# Patient Record
Sex: Female | Born: 1950 | Race: White | Hispanic: No | Marital: Single | Smoking: Never smoker
Health system: Southern US, Community
[De-identification: ages and names within clinical notes are randomized; demographics above are authoritative.]

## PROBLEM LIST (undated history)

## (undated) DIAGNOSIS — M1990 Unspecified osteoarthritis, unspecified site: Secondary | ICD-10-CM

## (undated) DIAGNOSIS — R928 Other abnormal and inconclusive findings on diagnostic imaging of breast: Secondary | ICD-10-CM

## (undated) DIAGNOSIS — M542 Cervicalgia: Secondary | ICD-10-CM

## (undated) DIAGNOSIS — H4020X0 Unspecified primary angle-closure glaucoma, stage unspecified: Secondary | ICD-10-CM

## (undated) DIAGNOSIS — F419 Anxiety disorder, unspecified: Secondary | ICD-10-CM

## (undated) HISTORY — PX: BREAST BIOPSY: SHX20

## (undated) HISTORY — PX: MOUTH SURGERY: SHX715

## (undated) HISTORY — DX: Unspecified osteoarthritis, unspecified site: M19.90

---

## 2010-10-16 ENCOUNTER — Encounter: Admission: RE | Admit: 2010-10-16 | Discharge: 2010-10-16 | Payer: Self-pay | Admitting: *Deleted

## 2011-09-27 ENCOUNTER — Other Ambulatory Visit: Payer: Self-pay | Admitting: Internal Medicine

## 2011-09-27 DIAGNOSIS — Z1231 Encounter for screening mammogram for malignant neoplasm of breast: Secondary | ICD-10-CM

## 2011-10-21 ENCOUNTER — Other Ambulatory Visit: Payer: Self-pay | Admitting: Family Medicine

## 2011-10-21 ENCOUNTER — Ambulatory Visit
Admission: RE | Admit: 2011-10-21 | Discharge: 2011-10-21 | Disposition: A | Discharge status: Home / Self Care | Payer: Private Health Insurance - Indemnity | Source: Ambulatory Visit | Attending: Internal Medicine | Admitting: Internal Medicine

## 2011-10-21 DIAGNOSIS — Z1231 Encounter for screening mammogram for malignant neoplasm of breast: Secondary | ICD-10-CM

## 2011-10-26 ENCOUNTER — Other Ambulatory Visit: Payer: Self-pay

## 2011-10-26 ENCOUNTER — Other Ambulatory Visit (HOSPITAL_COMMUNITY)
Admission: RE | Admit: 2011-10-26 | Discharge: 2011-10-26 | Disposition: A | Discharge status: Home / Self Care | Payer: Private Health Insurance - Indemnity | Source: Ambulatory Visit | Attending: Family Medicine | Admitting: Family Medicine

## 2011-10-26 DIAGNOSIS — Z01419 Encounter for gynecological examination (general) (routine) without abnormal findings: Secondary | ICD-10-CM | POA: Insufficient documentation

## 2011-11-01 ENCOUNTER — Other Ambulatory Visit: Payer: Self-pay | Admitting: Family Medicine

## 2011-11-01 DIAGNOSIS — R928 Other abnormal and inconclusive findings on diagnostic imaging of breast: Secondary | ICD-10-CM

## 2011-11-15 ENCOUNTER — Ambulatory Visit
Admission: RE | Admit: 2011-11-15 | Discharge: 2011-11-15 | Disposition: A | Discharge status: Home / Self Care | Payer: Private Health Insurance - Indemnity | Source: Ambulatory Visit | Attending: Family Medicine | Admitting: Family Medicine

## 2011-11-15 ENCOUNTER — Other Ambulatory Visit: Payer: Self-pay | Admitting: Family Medicine

## 2011-11-15 DIAGNOSIS — R928 Other abnormal and inconclusive findings on diagnostic imaging of breast: Secondary | ICD-10-CM

## 2011-11-15 DIAGNOSIS — R921 Mammographic calcification found on diagnostic imaging of breast: Secondary | ICD-10-CM

## 2011-11-22 ENCOUNTER — Ambulatory Visit
Admission: RE | Admit: 2011-11-22 | Discharge: 2011-11-22 | Disposition: A | Discharge status: Home / Self Care | Payer: Private Health Insurance - Indemnity | Source: Ambulatory Visit | Attending: Family Medicine | Admitting: Family Medicine

## 2011-11-22 DIAGNOSIS — R921 Mammographic calcification found on diagnostic imaging of breast: Secondary | ICD-10-CM

## 2011-12-14 DIAGNOSIS — R928 Other abnormal and inconclusive findings on diagnostic imaging of breast: Secondary | ICD-10-CM

## 2011-12-14 HISTORY — DX: Other abnormal and inconclusive findings on diagnostic imaging of breast: R92.8

## 2011-12-16 ENCOUNTER — Ambulatory Visit (INDEPENDENT_AMBULATORY_CARE_PROVIDER_SITE_OTHER): Payer: Private Health Insurance - Indemnity | Admitting: General Surgery

## 2011-12-16 ENCOUNTER — Encounter (INDEPENDENT_AMBULATORY_CARE_PROVIDER_SITE_OTHER): Payer: Self-pay | Admitting: General Surgery

## 2011-12-16 VITALS — BP 121/65 | HR 80 | Temp 99.2°F | Resp 17 | Ht 62.0 in | Wt 135.0 lb

## 2011-12-16 DIAGNOSIS — R921 Mammographic calcification found on diagnostic imaging of breast: Secondary | ICD-10-CM

## 2011-12-16 DIAGNOSIS — R928 Other abnormal and inconclusive findings on diagnostic imaging of breast: Secondary | ICD-10-CM

## 2011-12-16 NOTE — Progress Notes (Signed)
Patient ID: Carolyn Cantu, female   DOB: June 06, 1951, 61 y.o.   MRN: [id number]  Chief Complaint  Patient presents with  . Other    abnormal mmg    HPI Carolyn Cantu is a 61 y.o. female.  Referred by Dr. Laveda Abbe HPI 40 yof with history of left breast core biopsy for benign lesion who presents with abnormal calcifications on her routine screening mammogram.  These are 2mm deep right breast calcifications.  She was unable to undergo stereo biopsy due to inability to tolerate positioning.  She reports some occasional breast tenderness R>L but otherwise notes no masses, discharge or any other complaints referable to her breasts.  She does have family history in aunt in 39s and GM in 39s with breast cancer.  Past Medical History  Diagnosis Date  . Arthritis     Past Surgical History  Procedure Date  . Foot surgery     bilateral  . Mouth surgery     Family History  Problem Relation Age of Onset  . Cancer Paternal Aunt   . Cancer Maternal Grandmother     Social History History  Substance Use Topics  . Smoking status: Never Smoker   . Smokeless tobacco: Not on file  . Alcohol Use: No    No Known Allergies  No current outpatient prescriptions on file.    Review of Systems Review of Systems  Constitutional: Negative for fever, chills and unexpected weight change.  HENT: Negative for hearing loss, congestion, sore throat, trouble swallowing and voice change.   Eyes: Negative for visual disturbance.  Respiratory: Negative for cough and wheezing.   Cardiovascular: Negative for chest pain, palpitations and leg swelling.  Gastrointestinal: Negative for nausea, vomiting, abdominal pain, diarrhea, constipation, blood in stool, abdominal distention and anal bleeding.  Genitourinary: Negative for hematuria, vaginal bleeding and difficulty urinating.  Musculoskeletal: Negative for arthralgias.  Skin: Negative for rash and wound.  Neurological: Negative for seizures, syncope  and headaches.  Hematological: Negative for adenopathy. Does not bruise/bleed easily.  Psychiatric/Behavioral: Negative for confusion.    Blood pressure 121/65, pulse 80, temperature 99.2 F (37.3 C), temperature source Temporal, resp. rate 17, height 5\' 2"  (1.575 m), weight 135 lb (61.236 kg).  Physical Exam Physical Exam  Constitutional: She appears well-developed and well-nourished.  Neck: Neck supple.  Cardiovascular: Normal rate, regular rhythm and normal heart sounds.   Pulmonary/Chest: Effort normal and breath sounds normal. She has no wheezes. She has no rales. She exhibits no tenderness. Right breast exhibits no inverted nipple, no mass, no nipple discharge, no skin change and no tenderness. Left breast exhibits no inverted nipple, no mass, no nipple discharge, no skin change and no tenderness. Breasts are symmetrical.  Lymphadenopathy:    She has no cervical adenopathy.    She has no axillary adenopathy.       Right: No supraclavicular adenopathy present.       Left: No supraclavicular adenopathy present.    Data Reviewed MMG reviewed  Assessment    Right breast microcalcifications    Plan    We discussed mammography findings.  She was unable to undergo stereotactic biopsy due to positioning.  We discussed rationale for biopsy to ensure this is not breast cancer.  We discussed a wire localization.  Risks include bleeding, infection, possible further surgery if cancer found.         WAKEFIELD,MATTHEW 12/16/2011, 9:02 PM

## 2011-12-17 ENCOUNTER — Other Ambulatory Visit (INDEPENDENT_AMBULATORY_CARE_PROVIDER_SITE_OTHER): Payer: Self-pay | Admitting: General Surgery

## 2011-12-17 DIAGNOSIS — R921 Mammographic calcification found on diagnostic imaging of breast: Secondary | ICD-10-CM

## 2011-12-31 ENCOUNTER — Encounter (HOSPITAL_BASED_OUTPATIENT_CLINIC_OR_DEPARTMENT_OTHER): Payer: Self-pay | Admitting: *Deleted

## 2011-12-31 NOTE — Pre-Procedure Instructions (Signed)
To come for BMET and CBC 

## 2012-01-03 ENCOUNTER — Telehealth (INDEPENDENT_AMBULATORY_CARE_PROVIDER_SITE_OTHER): Payer: Self-pay

## 2012-01-03 NOTE — Telephone Encounter (Signed)
LMOM stating that I did speak with Dr Dwain Sarna and he will call her tommorow before or after our clinic to answer some of her questions.

## 2012-01-04 ENCOUNTER — Telehealth (INDEPENDENT_AMBULATORY_CARE_PROVIDER_SITE_OTHER): Payer: Self-pay | Admitting: General Surgery

## 2012-01-04 ENCOUNTER — Ambulatory Visit (HOSPITAL_BASED_OUTPATIENT_CLINIC_OR_DEPARTMENT_OTHER)
Admission: RE | Admit: 2012-01-04 | Discharge: 2012-01-04 | Disposition: A | Discharge status: Home / Self Care | Payer: Private Health Insurance - Indemnity | Source: Ambulatory Visit | Attending: General Surgery | Admitting: General Surgery

## 2012-01-04 DIAGNOSIS — Z538 Procedure and treatment not carried out for other reasons: Secondary | ICD-10-CM | POA: Insufficient documentation

## 2012-01-04 DIAGNOSIS — Z01812 Encounter for preprocedural laboratory examination: Secondary | ICD-10-CM | POA: Insufficient documentation

## 2012-01-04 DIAGNOSIS — N63 Unspecified lump in unspecified breast: Secondary | ICD-10-CM | POA: Insufficient documentation

## 2012-01-04 LAB — BASIC METABOLIC PANEL
BUN: 17 mg/dL (ref 6–23)
CO2: 30 mEq/L (ref 19–32)
Calcium: 9.7 mg/dL (ref 8.4–10.5)
Chloride: 102 mEq/L (ref 96–112)
Creatinine, Ser: 0.86 mg/dL (ref 0.50–1.10)
GFR calc Af Amer: 83 mL/min — ABNORMAL LOW (ref >90)
GFR calc non Af Amer: 71 mL/min — ABNORMAL LOW (ref >90)
Glucose, Bld: 91 mg/dL (ref 70–99)
Potassium: 4.1 mEq/L (ref 3.5–5.1)
Sodium: 141 mEq/L (ref 135–145)

## 2012-01-04 LAB — CBC
HCT: 38.4 % (ref 36.0–46.0)
Hemoglobin: 13.1 g/dL (ref 12.0–15.0)
MCH: 29.8 pg (ref 26.0–34.0)
MCHC: 34.1 g/dL (ref 30.0–36.0)
MCV: 87.5 fL (ref 78.0–100.0)
Platelets: 192 10*3/uL (ref 150–400)
RBC: 4.39 MIL/uL (ref 3.87–5.11)
RDW: 12.9 % (ref 11.5–15.5)
WBC: 7.3 10*3/uL (ref 4.0–10.5)

## 2012-01-04 NOTE — Telephone Encounter (Signed)
Carolyn Cantu called and explained that her brother had a DVT and was diagnosed with APC resistance.  She has never had an issue with vte and this is low risk procedure tomorrow.  I told her that I would not plan on proceeding any differently except for early ambulation, scds during operation.

## 2012-01-05 ENCOUNTER — Ambulatory Visit (HOSPITAL_BASED_OUTPATIENT_CLINIC_OR_DEPARTMENT_OTHER)
Admission: RE | Admit: 2012-01-05 | Payer: Private Health Insurance - Indemnity | Source: Ambulatory Visit | Admitting: General Surgery

## 2012-01-05 ENCOUNTER — Encounter (HOSPITAL_BASED_OUTPATIENT_CLINIC_OR_DEPARTMENT_OTHER): Admission: RE | Payer: Self-pay | Source: Ambulatory Visit

## 2012-01-05 ENCOUNTER — Telehealth (INDEPENDENT_AMBULATORY_CARE_PROVIDER_SITE_OTHER): Payer: Self-pay

## 2012-01-05 ENCOUNTER — Encounter (HOSPITAL_BASED_OUTPATIENT_CLINIC_OR_DEPARTMENT_OTHER): Payer: Self-pay | Admitting: Anesthesiology

## 2012-01-05 ENCOUNTER — Ambulatory Visit
Admission: RE | Admit: 2012-01-05 | Discharge: 2012-01-05 | Disposition: A | Discharge status: Home / Self Care | Payer: Private Health Insurance - Indemnity | Source: Ambulatory Visit | Attending: General Surgery | Admitting: General Surgery

## 2012-01-05 DIAGNOSIS — R921 Mammographic calcification found on diagnostic imaging of breast: Secondary | ICD-10-CM

## 2012-01-05 HISTORY — DX: Unspecified primary angle-closure glaucoma, stage unspecified: H40.20X0

## 2012-01-05 HISTORY — DX: Other abnormal and inconclusive findings on diagnostic imaging of breast: R92.8

## 2012-01-05 HISTORY — DX: Cervicalgia: M54.2

## 2012-01-05 HISTORY — DX: Anxiety disorder, unspecified: F41.9

## 2012-01-05 SURGERY — BREAST BIOPSY WITH NEEDLE LOCALIZATION
Anesthesia: General | Site: Breast | Laterality: Right

## 2012-01-05 NOTE — Telephone Encounter (Signed)
Spoke to pt about her surgery getting canceled this am after speaking with Dr Deboraha Sprang and she is fine with all the information given to her. The pt will plan on getting her mgm in 6 months by the [facility]. The pt is very pleased with her care and how she got to speak with Dr Deboraha Sprang this am about her mgm's. The pt said she is very thankful for Dr Dwain Sarna and if she ever has to have surgery she only wants him b/c she thinks the world of him. I advised the pt if any changes to please call us.

## 2012-01-05 NOTE — Telephone Encounter (Signed)
LMOM for pt to call me. 

## 2012-04-13 ENCOUNTER — Other Ambulatory Visit: Payer: Self-pay | Admitting: Family Medicine

## 2012-04-13 DIAGNOSIS — R921 Mammographic calcification found on diagnostic imaging of breast: Secondary | ICD-10-CM

## 2012-05-16 ENCOUNTER — Ambulatory Visit
Admission: RE | Admit: 2012-05-16 | Discharge: 2012-05-16 | Disposition: A | Discharge status: Home / Self Care | Payer: Private Health Insurance - Indemnity | Source: Ambulatory Visit | Attending: Family Medicine | Admitting: Family Medicine

## 2012-05-16 DIAGNOSIS — R921 Mammographic calcification found on diagnostic imaging of breast: Secondary | ICD-10-CM

## 2012-09-19 ENCOUNTER — Other Ambulatory Visit: Payer: Self-pay | Admitting: Family Medicine

## 2012-09-19 DIAGNOSIS — R921 Mammographic calcification found on diagnostic imaging of breast: Secondary | ICD-10-CM

## 2012-11-16 ENCOUNTER — Ambulatory Visit
Admission: RE | Admit: 2012-11-16 | Discharge: 2012-11-16 | Disposition: A | Discharge status: Home / Self Care | Payer: Private Health Insurance - Indemnity | Source: Ambulatory Visit | Attending: Family Medicine | Admitting: Family Medicine

## 2012-11-16 DIAGNOSIS — R921 Mammographic calcification found on diagnostic imaging of breast: Secondary | ICD-10-CM

## 2012-11-24 ENCOUNTER — Other Ambulatory Visit: Payer: Self-pay | Admitting: Family Medicine

## 2012-11-24 ENCOUNTER — Ambulatory Visit
Admission: RE | Admit: 2012-11-24 | Discharge: 2012-11-24 | Disposition: A | Discharge status: Home / Self Care | Payer: Private Health Insurance - Indemnity | Source: Ambulatory Visit | Attending: Family Medicine | Admitting: Family Medicine

## 2012-11-24 ENCOUNTER — Ambulatory Visit: Admission: RE | Admit: 2012-11-24 | Payer: Private Health Insurance - Indemnity | Source: Ambulatory Visit

## 2012-11-24 DIAGNOSIS — R921 Mammographic calcification found on diagnostic imaging of breast: Secondary | ICD-10-CM

## 2013-10-18 ENCOUNTER — Other Ambulatory Visit: Payer: Self-pay | Admitting: Family Medicine

## 2013-10-18 DIAGNOSIS — R921 Mammographic calcification found on diagnostic imaging of breast: Secondary | ICD-10-CM

## 2013-11-26 ENCOUNTER — Ambulatory Visit
Admission: RE | Admit: 2013-11-26 | Discharge: 2013-11-26 | Disposition: A | Discharge status: Home / Self Care | Payer: Private Health Insurance - Indemnity | Source: Ambulatory Visit | Attending: Family Medicine | Admitting: Family Medicine

## 2013-11-26 DIAGNOSIS — R921 Mammographic calcification found on diagnostic imaging of breast: Secondary | ICD-10-CM

## 2014-10-16 ENCOUNTER — Other Ambulatory Visit: Payer: Self-pay

## 2014-10-16 DIAGNOSIS — Z1231 Encounter for screening mammogram for malignant neoplasm of breast: Secondary | ICD-10-CM

## 2014-11-15 ENCOUNTER — Other Ambulatory Visit: Payer: Self-pay | Admitting: Family Medicine

## 2014-11-15 ENCOUNTER — Other Ambulatory Visit (HOSPITAL_COMMUNITY)
Admission: RE | Admit: 2014-11-15 | Discharge: 2014-11-15 | Disposition: A | Discharge status: Home / Self Care | Payer: BC Managed Care – PPO | Source: Ambulatory Visit | Attending: Family Medicine | Admitting: Family Medicine

## 2014-11-15 DIAGNOSIS — Z01419 Encounter for gynecological examination (general) (routine) without abnormal findings: Secondary | ICD-10-CM | POA: Insufficient documentation

## 2014-11-18 LAB — CYTOLOGY - PAP

## 2014-11-27 ENCOUNTER — Ambulatory Visit
Admission: RE | Admit: 2014-11-27 | Discharge: 2014-11-27 | Disposition: A | Discharge status: Home / Self Care | Payer: BC Managed Care – PPO | Source: Ambulatory Visit

## 2014-11-27 DIAGNOSIS — Z1231 Encounter for screening mammogram for malignant neoplasm of breast: Secondary | ICD-10-CM

## 2015-10-24 ENCOUNTER — Other Ambulatory Visit: Payer: Self-pay

## 2015-10-24 DIAGNOSIS — Z1231 Encounter for screening mammogram for malignant neoplasm of breast: Secondary | ICD-10-CM

## 2015-12-02 ENCOUNTER — Ambulatory Visit
Admission: RE | Admit: 2015-12-02 | Discharge: 2015-12-02 | Disposition: A | Discharge status: Home / Self Care | Payer: PPO | Source: Ambulatory Visit

## 2015-12-02 DIAGNOSIS — Z1231 Encounter for screening mammogram for malignant neoplasm of breast: Secondary | ICD-10-CM

## 2015-12-16 DIAGNOSIS — H2011 Chronic iridocyclitis, right eye: Secondary | ICD-10-CM | POA: Diagnosis not present

## 2016-02-10 DIAGNOSIS — H2012 Chronic iridocyclitis, left eye: Secondary | ICD-10-CM | POA: Diagnosis not present

## 2016-03-12 DIAGNOSIS — H2012 Chronic iridocyclitis, left eye: Secondary | ICD-10-CM | POA: Diagnosis not present

## 2016-05-27 DIAGNOSIS — Z1211 Encounter for screening for malignant neoplasm of colon: Secondary | ICD-10-CM | POA: Diagnosis not present

## 2016-06-11 DIAGNOSIS — H2012 Chronic iridocyclitis, left eye: Secondary | ICD-10-CM | POA: Diagnosis not present

## 2016-06-17 DIAGNOSIS — H40033 Anatomical narrow angle, bilateral: Secondary | ICD-10-CM | POA: Diagnosis not present

## 2016-06-17 DIAGNOSIS — H2513 Age-related nuclear cataract, bilateral: Secondary | ICD-10-CM | POA: Diagnosis not present

## 2016-06-17 DIAGNOSIS — H2012 Chronic iridocyclitis, left eye: Secondary | ICD-10-CM | POA: Diagnosis not present

## 2016-06-17 DIAGNOSIS — Z0100 Encounter for examination of eyes and vision without abnormal findings: Secondary | ICD-10-CM | POA: Diagnosis not present

## 2016-07-08 DIAGNOSIS — Z1211 Encounter for screening for malignant neoplasm of colon: Secondary | ICD-10-CM | POA: Diagnosis not present

## 2016-09-03 DIAGNOSIS — H2012 Chronic iridocyclitis, left eye: Secondary | ICD-10-CM | POA: Diagnosis not present

## 2016-09-24 ENCOUNTER — Other Ambulatory Visit: Payer: Self-pay | Admitting: Family Medicine

## 2016-09-24 DIAGNOSIS — Z1231 Encounter for screening mammogram for malignant neoplasm of breast: Secondary | ICD-10-CM

## 2016-12-02 ENCOUNTER — Ambulatory Visit
Admission: RE | Admit: 2016-12-02 | Discharge: 2016-12-02 | Disposition: A | Discharge status: Home / Self Care | Payer: PPO | Source: Ambulatory Visit | Attending: Family Medicine | Admitting: Family Medicine

## 2016-12-02 DIAGNOSIS — Z1231 Encounter for screening mammogram for malignant neoplasm of breast: Secondary | ICD-10-CM

## 2016-12-08 DIAGNOSIS — E559 Vitamin D deficiency, unspecified: Secondary | ICD-10-CM | POA: Diagnosis not present

## 2016-12-08 DIAGNOSIS — Z131 Encounter for screening for diabetes mellitus: Secondary | ICD-10-CM | POA: Diagnosis not present

## 2016-12-08 DIAGNOSIS — Z0000 Encounter for general adult medical examination without abnormal findings: Secondary | ICD-10-CM | POA: Diagnosis not present

## 2017-02-24 DIAGNOSIS — L298 Other pruritus: Secondary | ICD-10-CM | POA: Diagnosis not present

## 2017-04-11 DIAGNOSIS — L309 Dermatitis, unspecified: Secondary | ICD-10-CM | POA: Diagnosis not present

## 2017-04-11 DIAGNOSIS — J301 Allergic rhinitis due to pollen: Secondary | ICD-10-CM | POA: Diagnosis not present

## 2017-04-11 DIAGNOSIS — J3081 Allergic rhinitis due to animal (cat) (dog) hair and dander: Secondary | ICD-10-CM | POA: Diagnosis not present

## 2017-04-11 DIAGNOSIS — J3089 Other allergic rhinitis: Secondary | ICD-10-CM | POA: Diagnosis not present

## 2017-04-28 DIAGNOSIS — M5431 Sciatica, right side: Secondary | ICD-10-CM | POA: Diagnosis not present

## 2017-05-06 ENCOUNTER — Ambulatory Visit
Admission: RE | Admit: 2017-05-06 | Discharge: 2017-05-06 | Disposition: A | Discharge status: Home / Self Care | Payer: PPO | Source: Ambulatory Visit | Attending: Family Medicine | Admitting: Family Medicine

## 2017-05-06 ENCOUNTER — Other Ambulatory Visit: Payer: Self-pay | Admitting: Family Medicine

## 2017-05-06 DIAGNOSIS — M5418 Radiculopathy, sacral and sacrococcygeal region: Secondary | ICD-10-CM | POA: Diagnosis not present

## 2017-05-06 DIAGNOSIS — M533 Sacrococcygeal disorders, not elsewhere classified: Secondary | ICD-10-CM | POA: Diagnosis not present

## 2017-05-06 DIAGNOSIS — M5431 Sciatica, right side: Secondary | ICD-10-CM

## 2017-05-24 ENCOUNTER — Ambulatory Visit: Payer: PPO | Attending: Family Medicine | Admitting: Physical Therapy

## 2017-05-24 DIAGNOSIS — R293 Abnormal posture: Secondary | ICD-10-CM | POA: Diagnosis not present

## 2017-05-24 DIAGNOSIS — M5442 Lumbago with sciatica, left side: Secondary | ICD-10-CM | POA: Diagnosis not present

## 2017-05-24 DIAGNOSIS — R2689 Other abnormalities of gait and mobility: Secondary | ICD-10-CM | POA: Diagnosis not present

## 2017-05-24 DIAGNOSIS — M6281 Muscle weakness (generalized): Secondary | ICD-10-CM | POA: Diagnosis not present

## 2017-05-24 NOTE — Therapy (Signed)
[facility] [address], [place], [place], [address] Phone: [phone]   Fax:  [phone]  Physical Therapy Evaluation  Patient Details  Name: Carolyn Cantu MRN: [id number] Date of Birth: 1951/02/22 Referring Provider: Dr. Jonathon Jordan  Encounter Date: 05/24/2017      PT End of Session - 05/24/17 1331    Visit Number 1   Number of Visits 12   Date for PT Re-Evaluation 07/05/17   Authorization Type [employer]- 10th visit gcodes   PT Start Time 1230   PT Stop Time 1308   PT Time Calculation (min) 38 min   Activity Tolerance Patient tolerated treatment well   Behavior During Therapy [facility] for tasks assessed/performed      Past Medical History:  Diagnosis Date  . Anxiety current   due to diagnosis of breast mass  . Arthritis    right foot/wrist; neck  . Glaucoma, narrow-angle    had laser surg.; no current med.  . Mammogram abnormal 12/2011  . Neck pain    old injury from [facility]; difficulty turning head to the right    Past Surgical History:  Procedure Laterality Date  . MOUTH SURGERY  age 33    There were no vitals filed for this visit.       Subjective Assessment - 05/24/17 1233    Subjective Pt is a 66 y/o female who presents to OPPT with Lt sided LBP since early April following a single time of bending forward to get item out of lower cabinet.  Pt reports she's tried different medication without improvement.  Pt presents today with difficulty with sleeping and other ADLs.   Pertinent History osteoporosis, 2013 Rt sciatica   Limitations Sitting;Standing;Walking;House hold activities   How long can you sit comfortably? "some days I can do it, some days I can't"   Diagnostic tests xrays: OA   Patient Stated Goals improve pain, move without pain, improve strength   Currently in Pain? Yes   Pain Score 3   up to 9/10   Pain Location Back   Pain Orientation Left   Pain Descriptors /  Indicators Constant;Squeezing;Tightness   Pain Type Neuropathic pain   Pain Radiating Towards LLE into knee with some numbness   Pain Onset More than a month ago   Pain Frequency Constant   Aggravating Factors  prolonged positioning   Pain Relieving Factors epsom salt rub, stretches            [facility] PT Assessment - 05/24/17 1242      Assessment   Medical Diagnosis LLE sciatica   Referring Provider Dr. Jonathon Jordan   Onset Date/Surgical Date --  April 2018   Next MD Visit PRN   Prior Therapy none     Precautions   Precautions None     Restrictions   Weight Bearing Restrictions No     Balance Screen   Has the patient fallen in the past 6 months No   Has the patient had a decrease in activity level because of a fear of falling?  Yes   Is the patient reluctant to leave their home because of a fear of falling?  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 [employer]   Leisure spend time outside     Observation/Other Assessments   Focus on Therapeutic Outcomes (FOTO)  42 (58% limited; predicted 40% limited)     Posture/Postural Control   Posture/Postural Control Postural limitations   Postural Limitations Weight shift right;Right pelvic obliquity;Anterior pelvic tilt;Increased thoracic kyphosis;Forward head;Rounded Shoulders  Rt hip elevated     AROM   Overall AROM Comments lumbar flexion and extension limited ~ 75% with mild increase in pain     Strength   Strength Assessment Site Hip;Knee;Ankle   Right/Left Hip Right;Left   Right Hip Flexion 5/5   Right Hip Extension 3/5   Right Hip ABduction 4/5   Left Hip Flexion 4/5   Left Hip  Extension 3-/5   Left Hip ABduction 3+/5   Right/Left Knee Right;Left   Right Knee Flexion 4/5   Right Knee Extension 5/5   Left Knee Flexion 3+/5   Left Knee Extension 5/5   Right/Left Ankle Right;Left   Right Ankle Dorsiflexion 5/5   Left Ankle Dorsiflexion 4/5     Flexibility   Soft Tissue Assessment /Muscle Length yes   Hamstrings tightness Lt > Rt   Piriformis tightness Lt > Rt     Palpation   SI assessment  tenderness at bil PSIS     Special Tests    Special Tests Lumbar   Lumbar Tests Straight Leg Raise     Straight Leg Raise   Findings Positive   Side  Left     Ambulation/Gait   Gait Pattern Decreased stance time - left;Lateral trunk lean to right;Antalgic            Objective measurements completed on examination: See above findings.                  PT Education - 05/24/17 1331    Education provided Yes   Education Details HEP, use of ball for myofascial release   Person(s) Educated Patient   Methods Explanation;Demonstration;Handout   Comprehension Verbalized understanding;Returned demonstration;Need further instruction             PT Long Term Goals - 05/24/17 1340      PT LONG TERM GOAL #1   Title independent with HEP (07/05/17)   Time 6   Period Weeks   Status New     PT LONG TERM GOAL #2   Title improve lumbar ROM to no greater than 50% limited for improved function (07/05/17)   Time 6   Period Weeks   Status New     PT LONG TERM GOAL #3   Title demonstrate ability to amb > 500' without deviations for improved pain and function (07/05/17)   Time 6   Period Weeks   Status New     PT LONG TERM GOAL #4   Title report pain < 6/10 with activity for improved function (07/05/17)   Time 6   Period Weeks   Status New     PT LONG TERM GOAL #5   Title improve LLE strength to at least 4+/5 for improved functional mobility (07/05/17)   Time 6   Period Weeks   Status New                Plan - 05/24/17 1336     Clinical Impression Statement Pt is a 66 y/o female who presents to OPPT for 2  month history of Lt sided LBP.  Pt demonstrates decreased strength and ROM as well as active trigger points in glutes and hamstring.  Pt will benefit from PT to address deficits listed.   History and Personal Factors relevant to plan of care: anxiety, osteoporosis, OA   Clinical Presentation Evolving   Clinical Presentation due to: variable pain, medical history   Clinical Decision Making Moderate   Rehab Potential Good   PT Frequency 2x / week   PT Duration 6 weeks   PT Treatment/Interventions ADLs/Self Care Home Management;Cryotherapy;Electrical Stimulation;Moist Heat;Ultrasound;Traction;Neuromuscular re-education;Therapeutic exercise;Therapeutic activities;Functional mobility training;Gait training;Patient/family education;Manual techniques;Taping;Dry needling   PT Next Visit Plan review stretches, add core/hip strengthening to HEP, consider TDN, manual/modalities   Consulted and Agree with Plan of Care Patient      Patient will benefit from skilled therapeutic intervention in order to improve the following deficits and impairments:  Decreased activity tolerance, Decreased mobility, Decreased strength, Difficulty walking, Postural dysfunction, Abnormal gait, Pain, Increased fascial restricitons, Increased muscle spasms, Decreased range of motion  Visit Diagnosis: Acute left-sided low back pain with left-sided sciatica - Plan: PT plan of care cert/re-cert  Other abnormalities of gait and mobility - Plan: PT plan of care cert/re-cert  Muscle weakness (generalized) - Plan: PT plan of care cert/re-cert  Abnormal posture - Plan: PT plan of care cert/re-cert      G-Codes - 83/66/29 1343    Functional Assessment Tool Used (Outpatient Only) FOTO   Functional Limitation Mobility: Walking and moving around   Mobility: Walking and Moving Around Current Status (U7654) At least 40 percent but less than 60 percent  impaired, limited or restricted   Mobility: Walking and Moving Around Goal Status (Y5035) At least 20 percent but less than 40 percent impaired, limited or restricted       Problem List There are no active problems to display for this patient.    Laureen Abrahams, PT, DPT 05/24/17 1:46 PM    [facility] [address] [place], [place], [address] Phone: [phone]   Fax:  [phone]  Name: SAMEENA ARTUS MRN: [id number] Date of Birth: 09-Jul-1951

## 2017-05-24 NOTE — Patient Instructions (Signed)
Piriformis Stretch    Lying on back, pull left knee toward opposite shoulder. Hold __30__ seconds. Repeat __2-3__ times. Do __2-3__ sessions per day.  http://gt2.exer.us/258   Copyright  VHI. All rights reserved.   Hamstring Step 2    Left foot relaxed, knee straight, other leg bent, foot flat. Raise straight leg further upward to maximal range. Hold _30__ seconds. Relax leg completely down. Repeat __2-3_ times.  Perform 2-3 sessions per day.  Copyright  VHI. All rights reserved.    Use a semi-firm ball standing against wall to help with myofascial release.

## 2017-05-26 ENCOUNTER — Ambulatory Visit: Payer: PPO | Admitting: Rehabilitative and Restorative Service Providers"

## 2017-05-26 DIAGNOSIS — R293 Abnormal posture: Secondary | ICD-10-CM

## 2017-05-26 DIAGNOSIS — M5442 Lumbago with sciatica, left side: Secondary | ICD-10-CM

## 2017-05-26 NOTE — Therapy (Signed)
[facility] [address] [place], [place], [address] Phone: [phone]   Fax:  [phone]  Physical Therapy Treatment  Patient Details  Name: Carolyn Cantu MRN: [id number] Date of Birth: [birth year] Referring Provider: Dr. Jonathon Jordan  Encounter Date: 05/26/2017      PT End of Session - 05/26/17 1519    Visit Number 2   Number of Visits 12   Date for PT Re-Evaluation 07/05/17   Authorization Type [employer]- 10th visit gcodes   PT Start Time 1402   PT Stop Time 1447   PT Time Calculation (min) 45 min   Equipment Utilized During Treatment Gait belt   Activity Tolerance Patient tolerated treatment well;No increased pain   Behavior During Therapy WFL for tasks assessed/performed      Past Medical History:  Diagnosis Date  . Anxiety current   due to diagnosis of breast mass  . Arthritis    right foot/wrist; neck  . Glaucoma, narrow-angle    had laser surg.; no current med.  . Mammogram abnormal 12/2011  . Neck pain    old injury from [facility]; difficulty turning head to the right    Past Surgical History:  Procedure Laterality Date  . MOUTH SURGERY  age 2    There were no vitals filed for this visit.      Subjective Assessment - 05/26/17 1415    Subjective This is a really bad day.Marland KitchenMarland KitchenI woke up bad and it hasn't changed. The ball was bad for me. I can't do that yet.   Pertinent History osteoporosis, 2013 Rt sciatica   Limitations Sitting;Standing;Walking;House hold activities   How long can you sit comfortably? "some days I can do it, some days I can't"   Diagnostic tests xrays: OA   Patient Stated Goals improve pain, move without pain, improve strength   Currently in Pain? Yes   Pain Score 7    Pain Location Back   Pain Orientation Left   Pain Descriptors / Indicators Constant;Tightness;Sore   Pain Type Neuropathic pain   Pain Radiating Towards L LE to popliteal fossa   Pain Onset  More than a month ago   Pain Frequency Constant   Aggravating Factors  prolonged sitting    Pain Relieving Factors epsom salt, distraction of leg   Multiple Pain Sites No                         [facility] Adult PT Treatment/Exercise - 05/26/17 0001      Exercises   Exercises Lumbar;Knee/Hip     Lumbar Exercises: Seated   Other Seated Lumbar Exercises seated lumbar flexion to pt tolerance 2x30 sec with knees wide     Lumbar Exercises: Supine   Other Supine Lumbar Exercises reviewed HEP; L LE distraction decompression x 20 followed by R; bridge x 15     Lumbar Exercises: Sidelying   Other Sidelying Lumbar Exercises L hip abdct with knee flexion x 20;      Lumbar Exercises: Quadruped   Other Quadruped Lumbar Exercises prayer stretch modified so feet over the end of the bed due to R ankle restriction performed in neutral and to R side only 2x30 sec      Knee/Hip Exercises: Supine   Other Supine Knee/Hip Exercises L 90-90 Hamstring stretch 2x30 sec, L knee to opposite shoulder stretch 2x30 sec; Lower trunk rot x 3 with 5 sec holds     Modalities  Modalities Ultrasound     Ultrasound   Ultrasound Location L Piriformis   Ultrasound Parameters 100% 2.5 w/cm2 x 8 min   Ultrasound Goals Other (Comment)  for muscle relaxation; no adverse reaction post     Manual Therapy   Manual therapy comments L soft tissue work to L Piriformis with pt tender to palpation especially around sacral border and mid Piriformis                PT Education - 05/26/17 1515    Education provided Yes   Education Details recommended pt attempt to use heat on low back and L Piriformis for further muscle relaxation 10-15 min 2-3x/day   Person(s) Educated Patient   Methods Explanation   Comprehension Verbalized understanding             PT Long Term Goals - 05/24/17 1340      PT LONG TERM GOAL #1   Title independent with HEP (07/05/17)   Time 6   Period Weeks   Status New      PT LONG TERM GOAL #2   Title improve lumbar ROM to no greater than 50% limited for improved function (07/05/17)   Time 6   Period Weeks   Status New     PT LONG TERM GOAL #3   Title demonstrate ability to amb > 500' without deviations for improved pain and function (07/05/17)   Time 6   Period Weeks   Status New     PT LONG TERM GOAL #4   Title report pain < 6/10 with activity for improved function (07/05/17)   Time 6   Period Weeks   Status New     PT LONG TERM GOAL #5   Title improve LLE strength to at least 4+/5 for improved functional mobility (07/05/17)   Time 6   Period Weeks   Status New               Plan - 05/26/17 1519    Clinical Impression Statement Pt presents to PT with complaints of L hip pain; however, body language and facial expressions do not reflect the pain reported. Pt without pain for all activities except with soft tissue work at Piriformis insertion where pressure was modified. pt would benefit from further PT for modalities, manual therapy, and therext to improve L lumbar/hip flexibility to improve mobility and decrease pain with activities.    Rehab Potential Good   PT Frequency 2x / week   PT Duration 6 weeks   PT Treatment/Interventions ADLs/Self Care Home Management;Cryotherapy;Electrical Stimulation;Moist Heat;Ultrasound;Traction;Neuromuscular re-education;Therapeutic exercise;Therapeutic activities;Functional mobility training;Gait training;Patient/family education;Manual techniques;Taping;Dry needling   PT Next Visit Plan add core/hip strengthening to HEP, consider TDN, continue manual and hip flexibility activities   Consulted and Agree with Plan of Care Patient      Patient will benefit from skilled therapeutic intervention in order to improve the following deficits and impairments:  Decreased activity tolerance, Decreased mobility, Decreased strength, Difficulty walking, Postural dysfunction, Abnormal gait, Pain, Increased fascial  restricitons, Increased muscle spasms, Decreased range of motion  Visit Diagnosis: Acute left-sided low back pain with left-sided sciatica  Abnormal posture     Problem List There are no active problems to display for this patient.    Myra Rude, PT 05/26/2017, 3:24 PM  [facility] [address] [place], [place], [address] Phone: [phone]   Fax:  [phone]  Name: Carolyn Cantu MRN: [id number] Date of Birth: 1951/06/08

## 2017-05-30 ENCOUNTER — Encounter: Payer: Self-pay | Admitting: Physical Therapy

## 2017-05-30 ENCOUNTER — Ambulatory Visit: Payer: PPO | Admitting: Physical Therapy

## 2017-05-30 DIAGNOSIS — M5442 Lumbago with sciatica, left side: Secondary | ICD-10-CM

## 2017-05-30 DIAGNOSIS — R2689 Other abnormalities of gait and mobility: Secondary | ICD-10-CM

## 2017-05-30 DIAGNOSIS — M6281 Muscle weakness (generalized): Secondary | ICD-10-CM

## 2017-05-30 DIAGNOSIS — R293 Abnormal posture: Secondary | ICD-10-CM

## 2017-05-30 NOTE — Therapy (Signed)
[facility] [address], [place], [place], [address] Phone: [phone]   Fax:  [phone]  Physical Therapy Treatment  Patient Details  Name: Carolyn Cantu MRN: [id number] Date of Birth: 11-11-1951 Referring Provider: Dr. Jonathon Jordan  Encounter Date: 05/30/2017      PT End of Session - 05/30/17 1235    Visit Number 3   Number of Visits 12   Date for PT Re-Evaluation 07/05/17   Authorization Type [employer]- 10th visit gcodes   PT Start Time 1230   PT Stop Time 1315   PT Time Calculation (min) 45 min   Activity Tolerance Patient tolerated treatment well;No increased pain   Behavior During Therapy WFL for tasks assessed/performed      Past Medical History:  Diagnosis Date  . Anxiety current   due to diagnosis of breast mass  . Arthritis    right foot/wrist; neck  . Glaucoma, narrow-angle    had laser surg.; no current med.  . Mammogram abnormal 12/2011  . Neck pain    old injury from [facility]; difficulty turning head to the right    Past Surgical History:  Procedure Laterality Date  . MOUTH SURGERY  age 14    There were no vitals filed for this visit.      Subjective Assessment - 05/30/17 1236    Subjective Pain is no better, staying in her leg/glutes.    Pertinent History osteoporosis, 2013 Rt sciatica   Limitations Sitting;Standing;Walking;House hold activities   How long can you sit comfortably? "some days I can do it, some days I can't"   Diagnostic tests xrays: OA   Patient Stated Goals improve pain, move without pain, improve strength   Currently in Pain? Yes  LT post lateral knee 4/10, post thigh & glutes 6/10   Pain Orientation Left   Pain Descriptors / Indicators Sharp;Shooting;Stabbing   Aggravating Factors  sitting & standing    Pain Relieving Factors rest, meds   Multiple Pain Sites No                         [facility] Adult PT Treatment/Exercise -  05/30/17 0001      Lumbar Exercises: Stretches   Single Knee to Chest Stretch --  Lt knee to chest 4x 10 sec     Moist Heat Therapy   Number Minutes Moist Heat 15 Minutes   Moist Heat Location --  Lt glutes/lumbar     Acupuncturist Location Lt lu,bar to gluteals   Electrical Stimulation Action IFC   Electrical Stimulation Parameters 80-150 HZ   Electrical Stimulation Goals Pain     Manual Therapy   Soft tissue mobilization Rt sidelying: Lt lumbar to gluteals piriformis.   Pillows bt legs                     PT Long Term Goals - 05/30/17 1307      PT LONG TERM GOAL #1   Title independent with HEP (07/05/17)   Time 6   Period Weeks   Status Achieved     PT LONG TERM GOAL #2   Title improve lumbar ROM to no greater than 50% limited for improved function (07/05/17)   Time 6   Period Weeks   Status On-going     PT LONG TERM GOAL #3   Title demonstrate ability to amb > 500' without deviations for improved pain  and function (07/05/17)   Time 6   Period Weeks   Status On-going     PT LONG TERM GOAL #4   Time 6   Period Weeks   Status On-going               Plan - 05/30/17 1302    Clinical Impression Statement Pt reports initially she is in a lot of pain, pain was not much better, then after some discussing pt does report this pain is getting some better aand is not as constant any more.  The pain stays in the Lt glutes primarily and intermittently goes into her leg posteriorly and to the knee. She reports the more she stretches the better it feels. Pt continues to stand with signifcant forward flexion of the trunk and it kyphotic. She is aware of this and reports she thinks her posture is getting better and her old neck injury makes complete erect posture very hard. All soft tissues from Lt lumbar to the gluteal group were very tight and tender. Post session pain was better, down to a 2/10 in her leg.     Rehab Potential  Good   PT Frequency 2x / week   PT Duration 6 weeks   PT Treatment/Interventions ADLs/Self Care Home Management;Cryotherapy;Electrical Stimulation;Moist Heat;Ultrasound;Traction;Neuromuscular re-education;Therapeutic exercise;Therapeutic activities;Functional mobility training;Gait training;Patient/family education;Manual techniques;Taping;Dry needling   PT Next Visit Plan Manual to soft tissues, hip flexibility and body mechanics for bending and sttooping activities. Tell Pt how to get a TENS unit as she was interested.    Consulted and Agree with Plan of Care Patient      Patient will benefit from skilled therapeutic intervention in order to improve the following deficits and impairments:  Decreased activity tolerance, Decreased mobility, Decreased strength, Difficulty walking, Postural dysfunction, Abnormal gait, Pain, Increased fascial restricitons, Increased muscle spasms, Decreased range of motion  Visit Diagnosis: Acute left-sided low back pain with left-sided sciatica  Abnormal posture  Other abnormalities of gait and mobility  Muscle weakness (generalized)     Problem List There are no active problems to display for this patient.   COCHRAN,JENNIFER, PTA 05/30/2017, 2:06 PM  [facility] [address], [place], [place], [address] Phone: [phone]   Fax:  [phone]  Name: Carolyn Cantu MRN: [id number] Date of Birth: 1951-06-18

## 2017-06-01 ENCOUNTER — Ambulatory Visit: Payer: PPO

## 2017-06-01 DIAGNOSIS — M5442 Lumbago with sciatica, left side: Secondary | ICD-10-CM | POA: Diagnosis not present

## 2017-06-01 DIAGNOSIS — R293 Abnormal posture: Secondary | ICD-10-CM

## 2017-06-01 NOTE — Therapy (Signed)
[facility] [address], [place], [place], [address] Phone: [phone]   Fax:  [phone]  Physical Therapy Treatment  Patient Details  Name: Carolyn Cantu MRN: [id number] Date of Birth: [birth year] Referring Provider: Dr. Jonathon Jordan  Encounter Date: 06/01/2017      PT End of Session - 06/01/17 1407    Visit Number 4   Number of Visits 12   Date for PT Re-Evaluation 07/05/17   Authorization Type [employer]- 10th visit gcodes   PT Start Time 1402   PT Stop Time 1500   PT Time Calculation (min) 58 min   Activity Tolerance Patient tolerated treatment well;No increased pain   Behavior During Therapy WFL for tasks assessed/performed      Past Medical History:  Diagnosis Date  . Anxiety current   due to diagnosis of breast mass  . Arthritis    right foot/wrist; neck  . Glaucoma, narrow-angle    had laser surg.; no current med.  . Mammogram abnormal 12/2011  . Neck pain    old injury from [facility]; difficulty turning head to the right    Past Surgical History:  Procedure Laterality Date  . MOUTH SURGERY  age 67    There were no vitals filed for this visit.      Subjective Assessment - 06/01/17 1415    Subjective Pt. noting buttocks is hurting worse today.     Patient Stated Goals improve pain, move without pain, improve strength   Currently in Pain? Yes   Pain Score 6    Pain Location Buttocks   Pain Orientation Left   Pain Descriptors / Indicators Sharp;Shooting;Stabbing   Pain Type Neuropathic pain;Acute pain   Pain Radiating Towards into L thigh to knee    Pain Onset More than a month ago   Pain Frequency Constant   Aggravating Factors  Prolonged sitting, prolonged standing, prolonged walking    Multiple Pain Sites No                         [facility] Adult PT Treatment/Exercise - 06/01/17 1441      Lumbar Exercises: Supine   Bridge 15 reps;3 seconds   Bridge  Limitations With sustained hip abd/ER with red TB      Lumbar Exercises: Sidelying   Clam 15 reps;3 seconds   Clam Limitations R sidelying L hip clam shell      Knee/Hip Exercises: Stretches   Passive Hamstring Stretch 4 reps;Left;30 seconds   Passive Hamstring Stretch Limitations with strap   Piriformis Stretch Left;4 reps;30 seconds   Piriformis Stretch Limitations Figure-4, KTOS      Moist Heat Therapy   Number Minutes Moist Heat 15 Minutes   Moist Heat Location Hip  L hip in R sidelying with L LE on bolster     Electrical Stimulation   Electrical Stimulation Location Lt lumbar to gluteals   Electrical Stimulation Action IFC   Electrical Stimulation Parameters 80-150Hz , intensity to pt. tolerance, 15'    Electrical Stimulation Goals Pain     Manual Therapy   Manual Therapy Soft tissue mobilization;Myofascial release   Manual therapy comments R sidleying with L LE on bolster   Soft tissue mobilization STM to L glute complex/piriformis    Myofascial Release TPR to L glute complex/piriformis                     PT Long Term Goals - 05/30/17  [place] #1   Title independent with HEP (07/05/17)   Time 6   Period Weeks   Status Achieved     PT LONG TERM GOAL #2   Title improve lumbar ROM to no greater than 50% limited for improved function (07/05/17)   Time 6   Period Weeks   Status On-going     PT LONG TERM GOAL #3   Title demonstrate ability to amb > 500' without deviations for improved pain and function (07/05/17)   Time 6   Period Weeks   Status On-going     PT LONG TERM GOAL #4   Time 6   Period Weeks   Status On-going               Plan - 06/01/17 1408    Clinical Impression Statement Pt. entering therapy noting pain seems to be somewhat worse today in L buttocks.  L LE stretching with STM/TPR to L buttocks/piriformis today with pt. reporting lower pain levels following this.  Pt. noting HEP activities, "made my pain  worse".  Rationale behind need for LE flexibility explained to pt. today and pt. encouraged to perform HEP activities consistently.  Cueing for LE musculature relaxation with stretching today.  Pt. tolerated all activities well however noting some low-level buttocks pain to end treatment thus E-stim/moist heat applied to L buttocks/lumbar paraspinals.  Pt. leaving treatment pain free.     PT Treatment/Interventions ADLs/Self Care Home Management;Cryotherapy;Electrical Stimulation;Moist Heat;Ultrasound;Traction;Neuromuscular re-education;Therapeutic exercise;Therapeutic activities;Functional mobility training;Gait training;Patient/family education;Manual techniques;Taping;Dry needling   PT Next Visit Plan Check on status of TENS unit approval; Manual to soft tissues, hip flexibility and body mechanics for bending and sttooping activities       Patient will benefit from skilled therapeutic intervention in order to improve the following deficits and impairments:  Decreased activity tolerance, Decreased mobility, Decreased strength, Difficulty walking, Postural dysfunction, Abnormal gait, Pain, Increased fascial restricitons, Increased muscle spasms, Decreased range of motion  Visit Diagnosis: Acute left-sided low back pain with left-sided sciatica  Abnormal posture     Problem List There are no active problems to display for this patient.  Bess Harvest, PTA 06/01/17 5:26 PM   [facility] [address] [place], [place], [address] Phone: [phone]   Fax:  [phone]  Name: Carolyn Cantu MRN: [id number] Date of Birth: [birth year]

## 2017-06-07 ENCOUNTER — Ambulatory Visit: Payer: PPO | Admitting: Physical Therapy

## 2017-06-07 ENCOUNTER — Encounter: Payer: Self-pay | Admitting: Physical Therapy

## 2017-06-07 DIAGNOSIS — R293 Abnormal posture: Secondary | ICD-10-CM

## 2017-06-07 DIAGNOSIS — M5442 Lumbago with sciatica, left side: Secondary | ICD-10-CM

## 2017-06-07 DIAGNOSIS — R2689 Other abnormalities of gait and mobility: Secondary | ICD-10-CM

## 2017-06-07 DIAGNOSIS — M6281 Muscle weakness (generalized): Secondary | ICD-10-CM

## 2017-06-07 NOTE — Therapy (Signed)
[facility] [address] [place], [place], [address] Phone: [phone]   Fax:  [phone]  Physical Therapy Treatment  Patient Details  Name: Carolyn Cantu MRN: [id number] Date of Birth: Apr 21, 1951 Referring Provider: Dr. Jonathon Jordan  Encounter Date: 06/07/2017      PT End of Session - 06/07/17 1320    Visit Number 5   Number of Visits 12   Date for PT Re-Evaluation 07/05/17   Authorization Type [employer]- 10th visit gcodes   PT Start Time 4854   PT Stop Time 1327   PT Time Calculation (min) 53 min   Activity Tolerance Patient tolerated treatment well;No increased pain   Behavior During Therapy WFL for tasks assessed/performed      Past Medical History:  Diagnosis Date  . Anxiety current   due to diagnosis of breast mass  . Arthritis    right foot/wrist; neck  . Glaucoma, narrow-angle    had laser surg.; no current med.  . Mammogram abnormal 12/2011  . Neck pain    old injury from [facility]; difficulty turning head to the right    Past Surgical History:  Procedure Laterality Date  . MOUTH SURGERY  age 3    There were no vitals filed for this visit.      Subjective Assessment - 06/07/17 1238    Subjective It's really hard to get the knee and leg to stop hurting.  I feel it in the center of my back in the tailbone   Pertinent History osteoporosis, 2013 Rt sciatica   Limitations Sitting;Standing;Walking;House hold activities   How long can you sit comfortably? "some days I can do it, some days I can't"   Diagnostic tests xrays: OA   Patient Stated Goals improve pain, move without pain, improve strength   Currently in Pain? Yes   Pain Score 4    Pain Location Back   Pain Orientation Left   Pain Descriptors / Indicators Sharp   Pain Type Neuropathic pain;Acute pain   Pain Radiating Towards into thigh to the knee   Pain Onset More than a month ago   Aggravating Factors  some  stretches, sitting, standing, walking long time   Pain Relieving Factors rest, meds, self massage   Multiple Pain Sites No                         [facility] Adult PT Treatment/Exercise - 06/07/17 0001      Lumbar Exercises: Supine   Bridge 15 reps;3 seconds   Straight Leg Raise 20 reps     Lumbar Exercises: Sidelying   Other Sidelying Lumbar Exercises knee hugs lying on right side      Lumbar Exercises: Quadruped   Other Quadruped Lumbar Exercises prayer stretch modified so feet over the end of the bed due to R ankle restriction performed in neutral and to R side only 2x30 sec      Knee/Hip Exercises: Aerobic   Nustep L1 4 min   PT present no increased pain     Moist Heat Therapy   Number Minutes Moist Heat 15 Minutes   Moist Heat Location Hip     Electrical Stimulation   Electrical Stimulation Location sacral region   Electrical Stimulation Action IFC   Electrical Stimulation Parameters to tolerance x 15 min   Electrical Stimulation Goals Pain     Manual Therapy   Manual Therapy Soft tissue mobilization;Myofascial release   Manual  therapy comments R sidleying with L LE on bolster   Soft tissue mobilization STM to L glute complex/piriformis    Myofascial Release TPR to L glute complex/piriformis                PT Education - 06/07/17 1319    Education provided Yes   Education Details bridge, SLR, standing hamstring curl   Person(s) Educated Patient   Methods Explanation;Handout   Comprehension Verbalized understanding             PT Long Term Goals - 05/30/17 1307      PT LONG TERM GOAL #1   Title independent with HEP (07/05/17)   Time 6   Period Weeks   Status Achieved     PT LONG TERM GOAL #2   Title improve lumbar ROM to no greater than 50% limited for improved function (07/05/17)   Time 6   Period Weeks   Status On-going     PT LONG TERM GOAL #3   Title demonstrate ability to amb > 500' without deviations for improved pain and  function (07/05/17)   Time 6   Period Weeks   Status On-going     PT LONG TERM GOAL #4   Time 6   Period Weeks   Status On-going               Plan - 06/07/17 1321    Clinical Impression Statement Patient states she is feeling better today.  States some of the stretches cause more pain when she is lying on her back.  She had reduced symptoms on her back after STM to left glutes.  Pain behind her knee did not change.  Pt was educated in adding strengthening to HEP.  She continues ot need skilled PT for pain management and strengthening   PT Treatment/Interventions ADLs/Self Care Home Management;Cryotherapy;Electrical Stimulation;Moist Heat;Ultrasound;Traction;Neuromuscular re-education;Therapeutic exercise;Therapeutic activities;Functional mobility training;Gait training;Patient/family education;Manual techniques;Taping;Dry needling   PT Next Visit Plan Check on status of TENS unit approval; Manual to soft tissues, hip flexibility and body mechanics for bending and stooping activities    Consulted and Agree with Plan of Care Patient      Patient will benefit from skilled therapeutic intervention in order to improve the following deficits and impairments:  Decreased activity tolerance, Decreased mobility, Decreased strength, Difficulty walking, Postural dysfunction, Abnormal gait, Pain, Increased fascial restricitons, Increased muscle spasms, Decreased range of motion  Visit Diagnosis: Acute left-sided low back pain with left-sided sciatica  Abnormal posture  Other abnormalities of gait and mobility  Muscle weakness (generalized)     Problem List There are no active problems to display for this patient.   Zannie Cove, PT 06/07/2017, 1:41 PM  [facility] [address], [place], [place], [address] Phone: [phone]   Fax:  [phone]  Name: Carolyn Cantu MRN: [id number] Date of Birth:  03-27-1951

## 2017-06-07 NOTE — Patient Instructions (Signed)
Hamstring Curl    Can do this while holding onto the counter for balance. Lift arms out from sides for balance. Shift weight onto right leg, while bending left knee. Repeat, switching legs. Repeat _10__ times, alternating legs. Do __1_ times per day.  Copyright  VHI. All rights reserved.  Hip Flexion / Knee Extension: Straight-Leg Raise (Eccentric)    Lie on back. Lift leg with knee straight. Slowly lower leg for 3-5 seconds. _10_ reps per set, _2__ sets per day  Copyright  VHI. All rights reserved.  Bridge    Lie back, legs bent. Inhale, pressing hips up. Keeping ribs in, lengthen lower back. Exhale, rolling down along spine from top. Repeat __10__ times. Do __1__ sessions per day.  http://pm.exer.us/55   Copyright  VHI. All rights reserved.

## 2017-06-08 DIAGNOSIS — D2271 Melanocytic nevi of right lower limb, including hip: Secondary | ICD-10-CM | POA: Diagnosis not present

## 2017-06-08 DIAGNOSIS — D2239 Melanocytic nevi of other parts of face: Secondary | ICD-10-CM | POA: Diagnosis not present

## 2017-06-08 DIAGNOSIS — D1801 Hemangioma of skin and subcutaneous tissue: Secondary | ICD-10-CM | POA: Diagnosis not present

## 2017-06-08 DIAGNOSIS — D2261 Melanocytic nevi of right upper limb, including shoulder: Secondary | ICD-10-CM | POA: Diagnosis not present

## 2017-06-08 DIAGNOSIS — D2272 Melanocytic nevi of left lower limb, including hip: Secondary | ICD-10-CM | POA: Diagnosis not present

## 2017-06-08 DIAGNOSIS — L309 Dermatitis, unspecified: Secondary | ICD-10-CM | POA: Diagnosis not present

## 2017-06-08 DIAGNOSIS — L738 Other specified follicular disorders: Secondary | ICD-10-CM | POA: Diagnosis not present

## 2017-06-08 DIAGNOSIS — L821 Other seborrheic keratosis: Secondary | ICD-10-CM | POA: Diagnosis not present

## 2017-06-08 DIAGNOSIS — D2262 Melanocytic nevi of left upper limb, including shoulder: Secondary | ICD-10-CM | POA: Diagnosis not present

## 2017-06-08 DIAGNOSIS — L814 Other melanin hyperpigmentation: Secondary | ICD-10-CM | POA: Diagnosis not present

## 2017-06-09 ENCOUNTER — Ambulatory Visit: Payer: PPO | Admitting: Physical Therapy

## 2017-06-09 ENCOUNTER — Encounter: Payer: Self-pay | Admitting: Physical Therapy

## 2017-06-09 DIAGNOSIS — M5442 Lumbago with sciatica, left side: Secondary | ICD-10-CM | POA: Diagnosis not present

## 2017-06-09 DIAGNOSIS — R293 Abnormal posture: Secondary | ICD-10-CM

## 2017-06-09 DIAGNOSIS — R2689 Other abnormalities of gait and mobility: Secondary | ICD-10-CM

## 2017-06-09 DIAGNOSIS — M6281 Muscle weakness (generalized): Secondary | ICD-10-CM

## 2017-06-09 NOTE — Therapy (Signed)
[facility] [address] [place], [place], [address] Phone: [phone]   Fax:  [phone]  Physical Therapy Treatment  Patient Details  Name: Carolyn Cantu MRN: [id number] Date of Birth: 01/28/1951 Referring Provider: Dr. Jonathon Jordan  Encounter Date: 06/09/2017      PT End of Session - 06/09/17 1232    Visit Number 6   Number of Visits 12   Date for PT Re-Evaluation 07/05/17   Authorization Type [employer]- 10th visit gcodes   PT Start Time 3664   PT Stop Time 1327   PT Time Calculation (min) 54 min   Activity Tolerance Patient tolerated treatment well;No increased pain   Behavior During Therapy WFL for tasks assessed/performed      Past Medical History:  Diagnosis Date  . Anxiety current   due to diagnosis of breast mass  . Arthritis    right foot/wrist; neck  . Glaucoma, narrow-angle    had laser surg.; no current med.  . Mammogram abnormal 12/2011  . Neck pain    old injury from [facility]; difficulty turning head to the right    Past Surgical History:  Procedure Laterality Date  . MOUTH SURGERY  age 60    There were no vitals filed for this visit.      Subjective Assessment - 06/09/17 1236    Subjective My back in the middle and tailbone is hurting.  My thigh is tight.  I got a massage on my right leg and behind the knee.  When I got up I thought I could barely walk a few hours after that.     Pertinent History osteoporosis, 2013 Rt sciatica   Limitations Sitting;Standing;Walking;House hold activities   How long can you sit comfortably? "some days I can do it, some days I can't"   Diagnostic tests xrays: OA   Patient Stated Goals improve pain, move without pain, improve strength   Currently in Pain? Yes   Pain Location Back   Pain Orientation Left   Pain Type Neuropathic pain;Acute pain   Pain Onset More than a month ago   Pain Frequency Constant   Aggravating Factors  lying  on my back   Pain Relieving Factors rest, heat   Multiple Pain Sites No                         [facility] Adult PT Treatment/Exercise - 06/09/17 0001      Lumbar Exercises: Seated   Other Seated Lumbar Exercises seated horizontal abduction, D2 ext wiitout back support - 15x each     Knee/Hip Exercises: Aerobic   Nustep L1 8 min   PT present no increased pain     Moist Heat Therapy   Number Minutes Moist Heat 15 Minutes   Moist Heat Location Hip     Electrical Stimulation   Electrical Stimulation Location sacral region   Electrical Stimulation Action iFC   Electrical Stimulation Parameters to tolerance x 15 min   Electrical Stimulation Goals Pain     Manual Therapy   Manual Therapy Muscle Energy Technique   Muscle Energy Technique to increase posterior rotation of                      PT Long Term Goals - 06/09/17 1241      PT LONG TERM GOAL #1   Title independent with HEP (07/05/17)   Time 6   Period Weeks  Status Achieved     PT LONG TERM GOAL #2   Title improve lumbar ROM to no greater than 50% limited for improved function (07/05/17)   Time 6   Period Weeks   Status On-going     PT LONG TERM GOAL #3   Title demonstrate ability to amb > 500' without deviations for improved pain and function (07/05/17)   Time 6   Period Weeks   Status On-going     PT LONG TERM GOAL #4   Title report pain < 6/10 with activity for improved function (07/05/17)   Time 6   Period Weeks   Status On-going     PT LONG TERM GOAL #5   Title improve LLE strength to at least 4+/5 for improved functional mobility (07/05/17)   Time 6   Period Weeks   Status On-going               Plan - 06/09/17 1235    Clinical Impression Statement Patient had improved pelvic alignment with MET.  Pt continues to have pain in left SI and glute. Had reduced discomfort after MET and estim today.  She felt reduced symptoms with ball squeezes.  Pt did some postural  strengthening exericses in sitting for improved core strength.  She will benefit from overall strengthening and aligment for improved functional activities.   Rehab Potential Good   PT Treatment/Interventions ADLs/Self Care Home Management;Cryotherapy;Electrical Stimulation;Moist Heat;Ultrasound;Traction;Neuromuscular re-education;Therapeutic exercise;Therapeutic activities;Functional mobility training;Gait training;Patient/family education;Manual techniques;Taping;Dry needling   PT Next Visit Plan f/u if patient interested in TENS, check pelvis alignment MET as needed, core strength, body mechanics and posture   PT Home Exercise Plan add ball squeezes in hook lying   Consulted and Agree with Plan of Care Patient      Patient will benefit from skilled therapeutic intervention in order to improve the following deficits and impairments:  Decreased activity tolerance, Decreased mobility, Decreased strength, Difficulty walking, Postural dysfunction, Abnormal gait, Pain, Increased fascial restricitons, Increased muscle spasms, Decreased range of motion  Visit Diagnosis: Acute left-sided low back pain with left-sided sciatica  Abnormal posture  Other abnormalities of gait and mobility  Muscle weakness (generalized)     Problem List There are no active problems to display for this patient.   Zannie Cove, PT 06/09/2017, 2:02 PM  [facility] [address] [place], [place], [address] Phone: [phone]   Fax:  [phone]  Name: NADRA HRITZ MRN: [id number] Date of Birth: May 20, 1951

## 2017-06-13 ENCOUNTER — Ambulatory Visit: Payer: PPO | Attending: Family Medicine | Admitting: Physical Therapy

## 2017-06-13 ENCOUNTER — Encounter: Payer: Self-pay | Admitting: Physical Therapy

## 2017-06-13 DIAGNOSIS — M5442 Lumbago with sciatica, left side: Secondary | ICD-10-CM | POA: Diagnosis not present

## 2017-06-13 DIAGNOSIS — M6281 Muscle weakness (generalized): Secondary | ICD-10-CM

## 2017-06-13 DIAGNOSIS — R2689 Other abnormalities of gait and mobility: Secondary | ICD-10-CM | POA: Insufficient documentation

## 2017-06-13 DIAGNOSIS — R293 Abnormal posture: Secondary | ICD-10-CM | POA: Diagnosis not present

## 2017-06-13 NOTE — Patient Instructions (Signed)
Abduction: Clam (Eccentric) - Side-Lying    Lie on side with knees bent. Lift top knee, keeping feet together. Keep trunk steady. Slowly lower for 3-5 seconds. _10__ reps per set, __2_ sets per day,  http://ecce.exer.us/65   Copyright  VHI. All rights reserved.  Abduction Lift    Lie on one side. Tighten muscles on outside of hip to raise leg 6 ____ inches. Hold __2__ seconds. Repeat __10__ times. Do __2__ sessions per day.  Copyright  VHI. All rights reserved.

## 2017-06-13 NOTE — Therapy (Signed)
[facility] [address], [place], [place], [address] Phone: [phone]   Fax:  [phone]  Physical Therapy Treatment  Patient Details  Name: Carolyn Cantu MRN: [id number] Date of Birth: 08/23/1951 Referring Provider: Dr. Jonathon Jordan  Encounter Date: 06/13/2017      PT End of Session - 06/13/17 1145    Visit Number 7   Number of Visits 12   Date for PT Re-Evaluation 07/05/17   Authorization Type [employer]- 10th visit gcodes   PT Start Time 9485   PT Stop Time 1233   PT Time Calculation (min) 48 min   Activity Tolerance Patient tolerated treatment well;No increased pain   Behavior During Therapy WFL for tasks assessed/performed      Past Medical History:  Diagnosis Date  . Anxiety current   due to diagnosis of breast mass  . Arthritis    right foot/wrist; neck  . Glaucoma, narrow-angle    had laser surg.; no current med.  . Mammogram abnormal 12/2011  . Neck pain    old injury from [facility]; difficulty turning head to the right    Past Surgical History:  Procedure Laterality Date  . MOUTH SURGERY  age 18    There were no vitals filed for this visit.      Subjective Assessment - 06/13/17 1153    Subjective It hurts to lie down on my back.  The whole left side down the leg is sore.  I couldn't sleep on either side last night.   Pertinent History osteoporosis, 2013 Rt sciatica   Limitations Sitting;Standing;Walking;House hold activities   How long can you sit comfortably? "some days I can do it, some days I can't"   Diagnostic tests xrays: OA   Patient Stated Goals improve pain, move without pain, improve strength   Currently in Pain? Yes   Pain Score 8    Pain Location Back   Pain Orientation Left   Pain Descriptors / Indicators Sharp   Pain Type Neuropathic pain;Acute pain;Chronic pain   Pain Radiating Towards into the back of the knee   Pain Onset More than a month ago   Pain  Frequency Constant   Aggravating Factors  lying on my back, started this morning while standing making breakfast   Pain Relieving Factors rest, heat   Multiple Pain Sites No                         [facility] Adult PT Treatment/Exercise - 06/13/17 0001      Knee/Hip Exercises: Sidelying   Hip ABduction Strengthening;Right;Left;15 reps   Clams 15x each side     Moist Heat Therapy   Number Minutes Moist Heat 15 Minutes   Moist Heat Location Hip     Electrical Stimulation   Electrical Stimulation Location sacral region   Electrical Stimulation Action IFC   Electrical Stimulation Parameters to tolerance x 15 min   Electrical Stimulation Goals Pain     Manual Therapy   Manual Therapy Joint mobilization   Manual therapy comments R sidleying with L LE on bolster   Joint Mobilization left rotation to sacrum   Soft tissue mobilization STM to L glute complex/piriformis    Myofascial Release TPR to L glute complex/piriformis                PT Education - 06/13/17 1221    Education provided Yes   Education Details clam and hip abduction  Person(s) Educated Patient   Methods Explanation;Handout;Verbal cues;Tactile cues   Comprehension Verbalized understanding             PT Long Term Goals - 06/13/17 1335      PT LONG TERM GOAL #1   Title independent with HEP (07/05/17)   Time 6   Period Weeks   Status Achieved     PT LONG TERM GOAL #2   Title improve lumbar ROM to no greater than 50% limited for improved function (07/05/17)   Time 6   Period Weeks   Status On-going     PT LONG TERM GOAL #3   Title demonstrate ability to amb > 500' without deviations for improved pain and function (07/05/17)   Time 6   Period Weeks   Status On-going     PT LONG TERM GOAL #4   Title report pain < 6/10 with activity for improved function (07/05/17)   Time 6   Period Weeks   Status On-going     PT LONG TERM GOAL #5   Title improve LLE strength to at least 4+/5  for improved functional mobility (07/05/17)   Time 6   Period Weeks   Status On-going               Plan - 06/13/17 1148    Clinical Impression Statement Patient was in more pain today and unable to tolerate lying on her back.  She had reduced symptoms with manual thereapy and was able to add exercises to HEP, no increased pain.  Pt continues to need skilled PT for core and hip stabilization in order to be able to maintain correct alignment with functional activities.   PT Treatment/Interventions ADLs/Self Care Home Management;Cryotherapy;Electrical Stimulation;Moist Heat;Ultrasound;Traction;Neuromuscular re-education;Therapeutic exercise;Therapeutic activities;Functional mobility training;Gait training;Patient/family education;Manual techniques;Taping;Dry needling   PT Next Visit Plan Pelvic alignment, core and hip strengthening, strengthening in standing positions   Consulted and Agree with Plan of Care Patient      Patient will benefit from skilled therapeutic intervention in order to improve the following deficits and impairments:  Decreased activity tolerance, Decreased mobility, Decreased strength, Difficulty walking, Postural dysfunction, Abnormal gait, Pain, Increased fascial restricitons, Increased muscle spasms, Decreased range of motion  Visit Diagnosis: Acute left-sided low back pain with left-sided sciatica  Abnormal posture  Other abnormalities of gait and mobility  Muscle weakness (generalized)     Problem List There are no active problems to display for this patient.   Zannie Cove, PT 06/13/2017, 1:56 PM  [facility] [address] [place], [place], [address] Phone: [phone]   Fax:  [phone]  Name: Carolyn Cantu MRN: [id number] Date of Birth: 16-Dec-1950

## 2017-06-16 ENCOUNTER — Encounter: Payer: Self-pay | Admitting: Physical Therapy

## 2017-06-16 ENCOUNTER — Ambulatory Visit: Payer: PPO | Admitting: Physical Therapy

## 2017-06-16 DIAGNOSIS — M6281 Muscle weakness (generalized): Secondary | ICD-10-CM

## 2017-06-16 DIAGNOSIS — M5442 Lumbago with sciatica, left side: Secondary | ICD-10-CM

## 2017-06-16 DIAGNOSIS — R2689 Other abnormalities of gait and mobility: Secondary | ICD-10-CM

## 2017-06-16 DIAGNOSIS — R293 Abnormal posture: Secondary | ICD-10-CM

## 2017-06-16 NOTE — Patient Instructions (Signed)
Abdominal Bracing With Pelvic Floor (Hook-Lying)    With neutral spine, tighten pelvic floor and abdominals. March legs up and back down Repeat __20_ times. Do __1_ times a day.   Copyright  VHI. All rights reserved.

## 2017-06-16 NOTE — Therapy (Signed)
[facility] [address], [place], [place], [address] Phone: [phone]   Fax:  [phone]  Physical Therapy Treatment  Patient Details  Name: Carolyn Cantu MRN: [id number] Date of Birth: 1951/09/08 Referring Provider: Dr. Jonathon Jordan  Encounter Date: 06/16/2017      PT End of Session - 06/16/17 1233    Visit Number 8   Number of Visits 12   Date for PT Re-Evaluation 07/05/17   Authorization Type [employer]- 10th visit gcodes   PT Start Time 1232   PT Stop Time 1315   PT Time Calculation (min) 43 min   Activity Tolerance Patient tolerated treatment well;No increased pain   Behavior During Therapy WFL for tasks assessed/performed      Past Medical History:  Diagnosis Date  . Anxiety current   due to diagnosis of breast mass  . Arthritis    right foot/wrist; neck  . Glaucoma, narrow-angle    had laser surg.; no current med.  . Mammogram abnormal 12/2011  . Neck pain    old injury from [facility]; difficulty turning head to the right    Past Surgical History:  Procedure Laterality Date  . MOUTH SURGERY  age 66    There were no vitals filed for this visit.      Subjective Assessment - 06/16/17 1238    Subjective I got my TENS today.  Yesterday my leg was very sore, but today it is not as bad.  The massage helped.   Pertinent History osteoporosis, 2013 Rt sciatica   Limitations Sitting;Standing;Walking;House hold activities   How long can you sit comfortably? "some days I can do it, some days I can't"   Diagnostic tests xrays: OA   Patient Stated Goals improve pain, move without pain, improve strength   Pain Score 7    Pain Location Back   Pain Orientation Left   Pain Onset More than a month ago   Pain Frequency Constant   Multiple Pain Sites No                         [facility] Adult PT Treatment/Exercise - 06/16/17 0001      Lumbar Exercises: Supine   Bent Knee Raise  20 reps   Large Ball Oblique Isometric 20 reps;2 seconds  roll out with red ball   Other Supine Lumbar Exercises hip abduction red band; adduction with ab contract - 20x each     Lumbar Exercises: Sidelying   Clam 20 reps   Hip Abduction 10 reps  each side     Knee/Hip Exercises: Aerobic   Nustep L1 6 min   PT present no increased pain     Knee/Hip Exercises: Sidelying   Hip ABduction Strengthening;Right;Left;15 reps   Clams 15x each side     Moist Heat Therapy   Number Minutes Moist Heat --   Moist Heat Location --     Acupuncturist Location --   Printmaker Action --   Electrical Stimulation Parameters --   Electrical Stimulation Goals --     Manual Therapy   Manual Therapy Joint mobilization   Manual therapy comments R sidleying with L LE on bolster   Joint Mobilization left rotation to sacrum   Soft tissue mobilization STM to L glute complex/piriformis    Myofascial Release TPR to L glute complex/piriformis  PT Education - 06/16/17 1257    Education provided Yes   Education Details marching in hook lying   Person(s) Educated Patient   Methods Explanation;Handout   Comprehension Verbalized understanding             PT Long Term Goals - 06/13/17 1335      PT LONG TERM GOAL #1   Title independent with HEP (07/05/17)   Time 6   Period Weeks   Status Achieved     PT LONG TERM GOAL #2   Title improve lumbar ROM to no greater than 50% limited for improved function (07/05/17)   Time 6   Period Weeks   Status On-going     PT LONG TERM GOAL #3   Title demonstrate ability to amb > 500' without deviations for improved pain and function (07/05/17)   Time 6   Period Weeks   Status On-going     PT LONG TERM GOAL #4   Title report pain < 6/10 with activity for improved function (07/05/17)   Time 6   Period Weeks   Status On-going     PT LONG TERM GOAL #5   Title improve LLE strength to at  least 4+/5 for improved functional mobility (07/05/17)   Time 6   Period Weeks   Status On-going               Plan - 06/16/17 1254    Clinical Impression Statement Pelvis was neutral rotation alignemnt today, but has some sacral tilting to the right.  Pt able to add more exercises and tolerated laying supine with exercises.  Patient responded well to manual.  She does not put weight on left LE when standing and has decreased weight bearing on left with gait, but was improved with cues to contract glutes on left side.  Pt needs skilled PT for improved functional mobility and gait.   Rehab Potential Good   PT Treatment/Interventions ADLs/Self Care Home Management;Cryotherapy;Electrical Stimulation;Moist Heat;Ultrasound;Traction;Neuromuscular re-education;Therapeutic exercise;Therapeutic activities;Functional mobility training;Gait training;Patient/family education;Manual techniques;Taping;Dry needling   PT Next Visit Plan Pelvic alignment, core and hip strengthening, strengthening in standing positions, sit to stand and gait, reprint marching in hooklying for HEP   Consulted and Agree with Plan of Care Patient      Patient will benefit from skilled therapeutic intervention in order to improve the following deficits and impairments:  Decreased activity tolerance, Decreased mobility, Decreased strength, Difficulty walking, Postural dysfunction, Abnormal gait, Pain, Increased fascial restricitons, Increased muscle spasms, Decreased range of motion  Visit Diagnosis: Acute left-sided low back pain with left-sided sciatica  Abnormal posture  Other abnormalities of gait and mobility  Muscle weakness (generalized)     Problem List There are no active problems to display for this patient.   Zannie Cove, PT 06/16/2017, 1:27 PM  [facility] [address] [place], [place], [address] Phone: [phone]   Fax:   [phone]  Name: Carolyn Cantu MRN: [id number] Date of Birth: 1965/06/28

## 2017-06-20 ENCOUNTER — Ambulatory Visit: Payer: PPO | Admitting: Physical Therapy

## 2017-06-20 DIAGNOSIS — R293 Abnormal posture: Secondary | ICD-10-CM

## 2017-06-20 DIAGNOSIS — R2689 Other abnormalities of gait and mobility: Secondary | ICD-10-CM

## 2017-06-20 DIAGNOSIS — M5442 Lumbago with sciatica, left side: Secondary | ICD-10-CM | POA: Diagnosis not present

## 2017-06-20 DIAGNOSIS — M6281 Muscle weakness (generalized): Secondary | ICD-10-CM

## 2017-06-20 NOTE — Therapy (Signed)
[facility] [address] [place], [place], [address] Phone: [phone]   Fax:  [phone]  Physical Therapy Treatment  Patient Details  Name: Carolyn Cantu MRN: [id number] Date of Birth: [birth year] Referring Provider: Dr. Jonathon Jordan  Encounter Date: 06/20/2017      PT End of Session - 06/20/17 1459    Visit Number 9   Number of Visits 12   Date for PT Re-Evaluation 07/05/17   Authorization Type [employer]- 10th visit gcodes   PT Start Time 9833   PT Stop Time 1526   PT Time Calculation (min) 41 min   Activity Tolerance Patient tolerated treatment well;No increased pain   Behavior During Therapy WFL for tasks assessed/performed      Past Medical History:  Diagnosis Date  . Anxiety current   due to diagnosis of breast mass  . Arthritis    right foot/wrist; neck  . Glaucoma, narrow-angle    had laser surg.; no current med.  . Mammogram abnormal 12/2011  . Neck pain    old injury from [facility]; difficulty turning head to the right    Past Surgical History:  Procedure Laterality Date  . MOUTH SURGERY  age 55    There were no vitals filed for this visit.      Subjective Assessment - 06/20/17 1450    Subjective The TENS helped.  Last time I was here I was in a lot of pain that night.   Limitations Sitting;Standing;Walking;House hold activities   How long can you sit comfortably? "some days I can do it, some days I can't"   Diagnostic tests xrays: OA   Patient Stated Goals improve pain, move without pain, improve strength   Currently in Pain? Yes   Pain Score 7    Pain Location Back   Pain Orientation Left   Pain Onset More than a month ago   Pain Frequency Constant   Aggravating Factors  lying on my back, standing   Pain Relieving Factors rest, heat   Multiple Pain Sites No                         [facility] Adult PT Treatment/Exercise - 06/20/17 0001      Lumbar  Exercises: Supine   Ab Set 20 reps  red ball UE extension   Clam 20 reps  red band   Bent Knee Raise 20 reps   Other Supine Lumbar Exercises thoracic extension with towel roll 10x 5 sec     Lumbar Exercises: Sidelying   Other Sidelying Lumbar Exercises open book 10x 5 sec hold     Modalities   Modalities Ultrasound     Ultrasound   Ultrasound Location L SI joint   Ultrasound Parameters 20%, 1MHz, 1.3 W/Cm2     Manual Therapy   Myofascial Release left glutes                      PT Long Term Goals - 06/20/17 1457      PT LONG TERM GOAL #2   Title improve lumbar ROM to no greater than 50% limited for improved function (07/05/17)   Baseline sometimes I noticed I am standing straighter   Time 6   Period Weeks   Status On-going     PT LONG TERM GOAL #3   Title demonstrate ability to amb > 500' without deviations for improved pain and function (07/05/17)  Period Weeks   Status On-going     PT LONG TERM GOAL #4   Title report pain < 6/10 with activity for improved function (07/05/17)   Time 6   Period Weeks   Status On-going     PT LONG TERM GOAL #5   Title improve LLE strength to at least 4+/5 for improved functional mobility (07/05/17)   Time 6   Period Weeks   Status On-going               Plan - 06/20/17 1531    Clinical Impression Statement Pt was very inflammed in SI joint.  [place] was done to decreased inflammation and patient only tolerated Myofascial release manual technique due to inflammation.  Pt continues to work on progress core and pelvic stability.  She will benefit from skilled PT for improved strength and posture in order to do activities with decreased pain.   Rehab Potential Good   PT Treatment/Interventions ADLs/Self Care Home Management;Cryotherapy;Electrical Stimulation;Moist Heat;Ultrasound;Traction;Neuromuscular re-education;Therapeutic exercise;Therapeutic activities;Functional mobility training;Gait training;Patient/family  education;Manual techniques;Taping;Dry needling   PT Next Visit Plan Pelvic alignment, core and hip strengthening, strengthening in standing positions, sit to stand and gait, reprint marching in hooklying for HEP   Consulted and Agree with Plan of Care Patient      Patient will benefit from skilled therapeutic intervention in order to improve the following deficits and impairments:  Decreased activity tolerance, Decreased mobility, Decreased strength, Difficulty walking, Postural dysfunction, Abnormal gait, Pain, Increased fascial restricitons, Increased muscle spasms, Decreased range of motion  Visit Diagnosis: Acute left-sided low back pain with left-sided sciatica  Abnormal posture  Other abnormalities of gait and mobility  Muscle weakness (generalized)     Problem List There are no active problems to display for this patient.   Zannie Cove, PT 06/20/2017, 5:35 PM  [facility] [address], [place], [place], [address] Phone: [phone]   Fax:  [phone]  Name: QIANNA CLAGETT MRN: [id number] Date of Birth: 01-Dec-1951

## 2017-06-22 ENCOUNTER — Encounter: Payer: PPO | Admitting: Physical Therapy

## 2017-06-22 DIAGNOSIS — H2513 Age-related nuclear cataract, bilateral: Secondary | ICD-10-CM | POA: Diagnosis not present

## 2017-06-22 DIAGNOSIS — H35372 Puckering of macula, left eye: Secondary | ICD-10-CM | POA: Diagnosis not present

## 2017-06-22 DIAGNOSIS — H524 Presbyopia: Secondary | ICD-10-CM | POA: Diagnosis not present

## 2017-06-22 DIAGNOSIS — H2012 Chronic iridocyclitis, left eye: Secondary | ICD-10-CM | POA: Diagnosis not present

## 2017-06-23 ENCOUNTER — Encounter: Payer: Self-pay | Admitting: Physical Therapy

## 2017-06-23 ENCOUNTER — Ambulatory Visit: Payer: PPO | Admitting: Physical Therapy

## 2017-06-23 DIAGNOSIS — M5442 Lumbago with sciatica, left side: Secondary | ICD-10-CM | POA: Diagnosis not present

## 2017-06-23 DIAGNOSIS — R2689 Other abnormalities of gait and mobility: Secondary | ICD-10-CM

## 2017-06-23 DIAGNOSIS — M6281 Muscle weakness (generalized): Secondary | ICD-10-CM

## 2017-06-23 DIAGNOSIS — R293 Abnormal posture: Secondary | ICD-10-CM

## 2017-06-23 NOTE — Therapy (Signed)
[facility] [address] [place], [place], [address] Phone: [phone]   Fax:  [phone]  Physical Therapy Treatment  Patient Details  Name: Carolyn Cantu MRN: [id number] Date of Birth: [birth year] Referring Provider: Dr. Jonathon Jordan  Encounter Date: 06/23/2017      PT End of Session - 06/23/17 1447    Visit Number 10   Number of Visits 12   Date for PT Re-Evaluation 07/05/17   Authorization Type [employer]- 10th visit gcodes   PT Start Time 1572   PT Stop Time 1527   PT Time Calculation (min) 40 min   Activity Tolerance Patient tolerated treatment well;No increased pain   Behavior During Therapy WFL for tasks assessed/performed      Past Medical History:  Diagnosis Date  . Anxiety current   due to diagnosis of breast mass  . Arthritis    right foot/wrist; neck  . Glaucoma, narrow-angle    had laser surg.; no current med.  . Mammogram abnormal 12/2011  . Neck pain    old injury from [facility]; difficulty turning head to the right    Past Surgical History:  Procedure Laterality Date  . MOUTH SURGERY  age 8    There were no vitals filed for this visit.      Subjective Assessment - 06/23/17 1452    Subjective I have been good.  I had a massage and she pulled way back so I am not feeling as sore.   Pertinent History osteoporosis, 2013 Rt sciatica   Limitations Sitting;Standing;Walking;House hold activities   How long can you sit comfortably? "some days I can do it, some days I can't"   Diagnostic tests xrays: OA   Patient Stated Goals improve pain, move without pain, improve strength   Currently in Pain? Yes   Pain Score 5    Pain Location Back   Pain Orientation Left;Lower   Pain Radiating Towards into the back of the knee            [facility] PT Assessment - 06/23/17 0001      Strength   Left Hip ABduction 4-/5   Left Knee Flexion 5/5   Left Ankle Dorsiflexion 5/5                      [facility] Adult PT Treatment/Exercise - 06/23/17 0001      Self-Care   Self-Care Posture   Posture posture in sitting, using stool for improved pelvid alignment and take pressure off sacrum     Neuro Re-ed    Neuro Re-ed Details  educated and performed MET for HEP for hip stability and alignment     Manual Therapy   Myofascial Release left glutes    Muscle Energy Technique MET for right hip anterior rotation                PT Education - 06/23/17 1530    Education provided Yes   Education Details MET for right anterior rotation   Person(s) Educated Patient   Methods Explanation             PT Long Term Goals - 06/23/17 1739      PT LONG TERM GOAL #1   Title independent with HEP (07/05/17)   Time 6   Period Weeks   Status Achieved     PT LONG TERM GOAL #2   Title improve lumbar ROM to no greater than 50% limited for improved  function (07/05/17)   Baseline sometimes I noticed I am standing straighter   Period Weeks   Status On-going     PT LONG TERM GOAL #3   Title demonstrate ability to amb > 500' without deviations for improved pain and function (07/05/17)   Time 6   Period Weeks   Status On-going     PT LONG TERM GOAL #4   Title report pain < 6/10 with activity for improved function (07/05/17)   Time 6   Period Weeks   Status On-going     PT LONG TERM GOAL #5   Title improve LLE strength to at least 4+/5 for improved functional mobility (07/05/17)   Baseline shows improvedment from 3+/5 now 4-/5 to 5/5   Time 6   Period Weeks   Status On-going               Plan - 2017-07-22 1452    Clinical Impression Statement Pt shows improvement with increased strength of left LE.  She continues to have pelvic obliquity and increased muscle spasms of left glutes . Pt remains highly irritible with any movement.  Pt was educated on improved sitting posture which helped reduce pain.  She will benefit from skilled PT even though she  is progressing slowly due to her medical history and variable pain.  She is making some progress with improved strength and posture and is expected to continue to show progress at this time.   PT Treatment/Interventions ADLs/Self Care Home Management;Cryotherapy;Electrical Stimulation;Moist Heat;Ultrasound;Traction;Neuromuscular re-education;Therapeutic exercise;Therapeutic activities;Functional mobility training;Gait training;Patient/family education;Manual techniques;Taping;Dry needling   PT Next Visit Plan review posture, check pelvic alignment, core and hip strengthening, strengthening in standing positions, sit to stand and gait   Consulted and Agree with Plan of Care Patient      Patient will benefit from skilled therapeutic intervention in order to improve the following deficits and impairments:  Decreased activity tolerance, Decreased mobility, Decreased strength, Difficulty walking, Postural dysfunction, Abnormal gait, Pain, Increased fascial restricitons, Increased muscle spasms, Decreased range of motion  Visit Diagnosis: Acute left-sided low back pain with left-sided sciatica  Abnormal posture  Other abnormalities of gait and mobility  Muscle weakness (generalized)       G-Codes - July 22, 2017 1739    Functional Assessment Tool Used (Outpatient Only) FOTO   Functional Limitation Mobility: Walking and moving around   Mobility: Walking and Moving Around Current Status (D4081) At least 60 percent but less than 80 percent impaired, limited or restricted   Mobility: Walking and Moving Around Goal Status [phone]) At least 60 percent but less than 80 percent impaired, limited or restricted      Problem List There are no active problems to display for this patient.   Zannie Cove, PT July 22, 2017, 5:50 PM  [facility] [address] [place], [place], [address] Phone: [phone]   Fax:  [phone]  Name: Carolyn Cantu MRN: [id number] Date of Birth: 06-25-1951

## 2017-06-23 NOTE — Patient Instructions (Addendum)
   MET to Correct Right Anteriorly Rotated Innominate  Begin laying on your back with your feet at 90 degrees. Put a dowel/broomstick  through your legs, behind your right knee and in front of your left knee. Stabilize the dowel on ether side with your hands.  Press down with the right leg and up with the left leg. Hold for 6 seconds  then slowly relax. Repeat 10x  Sitting posture  Try having a stool to prop feet on

## 2017-06-28 ENCOUNTER — Ambulatory Visit: Payer: PPO | Admitting: Rehabilitative and Restorative Service Providers"

## 2017-06-28 DIAGNOSIS — R2689 Other abnormalities of gait and mobility: Secondary | ICD-10-CM

## 2017-06-28 DIAGNOSIS — R293 Abnormal posture: Secondary | ICD-10-CM

## 2017-06-28 DIAGNOSIS — M5442 Lumbago with sciatica, left side: Secondary | ICD-10-CM

## 2017-06-28 DIAGNOSIS — M6281 Muscle weakness (generalized): Secondary | ICD-10-CM

## 2017-06-28 NOTE — Therapy (Signed)
[facility] [address], [place], [place], [address] Phone: [phone]   Fax:  [phone]  Physical Therapy Treatment  Patient Details  Name: Carolyn Cantu MRN: [id number] Date of Birth: 1951-10-14 Referring Provider: Dr. Jonathon Jordan  Encounter Date: 06/28/2017      PT End of Session - 06/28/17 1204    Visit Number 11   Number of Visits 12   Date for PT Re-Evaluation 07/05/17   Authorization Type [employer]- 10th visit gcodes   PT Start Time 1146   PT Stop Time 1228   PT Time Calculation (min) 42 min   Activity Tolerance Patient tolerated treatment well;No increased pain   Behavior During Therapy WFL for tasks assessed/performed      Past Medical History:  Diagnosis Date  . Anxiety current   due to diagnosis of breast mass  . Arthritis    right foot/wrist; neck  . Glaucoma, narrow-angle    had laser surg.; no current med.  . Mammogram abnormal 12/2011  . Neck pain    old injury from [facility]; difficulty turning head to the right    Past Surgical History:  Procedure Laterality Date  . MOUTH SURGERY  age 16    There were no vitals filed for this visit.      Subjective Assessment - 06/28/17 1147    Subjective L gluteal and L Hamstring pain last night (sacral border to lateral Hamstring). I can stand up straighter longer. I am sore but not painful but in 5 min it should change; Pt reports 50% on average improvement with therapy.   Pertinent History osteoporosis, 2013 Rt sciatica   Limitations Sitting;Standing;Walking;House hold activities   How long can you sit comfortably? "some days I can do it, some days I can't"   Diagnostic tests xrays: OA   Patient Stated Goals improve pain, move without pain, improve strength   Currently in Pain? Yes   Pain Score 0-No pain   Aggravating Factors  exercises, standing   Pain Relieving Factors rest, heat, TENS   Multiple Pain Sites No                          [facility] Adult PT Treatment/Exercise - 06/28/17 0001      Lumbar Exercises: Supine   Other Supine Lumbar Exercises pelvic alignmen rechecked with slight inferior rotation L innominate; L quad set to stretch hip flexor x 10; tilt x 20 with max PT verbal and tactile cues for technique; tilt with heel slide x 20; tilt with bil shoulder flex/ext x 20; tilt with limited ROM clam shell x 20; tilt with bil oblique reach with hands on bed x 15; tilt with ball squeeze x 20; single knee to chest x 30 sec, Hamstring stretch x 30 sec; knee to chest with hip flexor stretch contralateral side 2x30 sec; all performed bil.     Knee/Hip Exercises: Sidelying   Other Sidelying Knee/Hip Exercises tilt with hip abdct limited ROM x 20                     PT Long Term Goals - 06/28/17 1208      PT LONG TERM GOAL #1   Title independent with HEP (07/05/17)   Status Achieved     PT LONG TERM GOAL #2   Title improve lumbar ROM to no greater than 50% limited for improved function (07/05/17)   Baseline pt reports definitely  standing straighter but unsure of how long   Time 6   Period Weeks   Status On-going     PT LONG TERM GOAL #3   Title demonstrate ability to amb > 500' without deviations for improved pain and function (07/05/17)   Time 6   Period Weeks   Status On-going     PT LONG TERM GOAL #4   Title report pain < 6/10 with activity for improved function (07/05/17)   Baseline 6-10/10 pain dependent on activity   Time 6   Period Weeks   Status On-going     PT LONG TERM GOAL #5   Title improve LLE strength to at least 4+/5 for improved functional mobility (07/05/17)   Baseline shows improvedment from 3+/5 now 4-/5 to 5/5   Time 6   Period Weeks   Status On-going               Plan - 06/28/17 1204    Clinical Impression Statement Pt presents to PT with decreased core/abdominal strength and L hip flexor tightness. Pt would benefit from further  PT for lumbar/core strengthening, pelvic asymmetry correction, and body mechanics/postural strengthening/education to reduce risk of LBP reoccurence. Pt desires to continue PT for further addressing deficits and reports 50% improvement on average with pain.    Rehab Potential Good   PT Frequency 2x / week   PT Duration 6 weeks   PT Treatment/Interventions ADLs/Self Care Home Management;Cryotherapy;Electrical Stimulation;Moist Heat;Ultrasound;Traction;Neuromuscular re-education;Therapeutic exercise;Therapeutic activities;Functional mobility training;Gait training;Patient/family education;Manual techniques;Taping;Dry needling   PT Next Visit Plan review posture, check pelvic alignment, core and hip strengthening, strengthening in standing positions, sit to stand and gait   Consulted and Agree with Plan of Care Patient      Patient will benefit from skilled therapeutic intervention in order to improve the following deficits and impairments:  Decreased activity tolerance, Decreased mobility, Decreased strength, Difficulty walking, Postural dysfunction, Abnormal gait, Pain, Increased fascial restricitons, Increased muscle spasms, Decreased range of motion  Visit Diagnosis: Acute left-sided low back pain with left-sided sciatica  Abnormal posture  Other abnormalities of gait and mobility  Muscle weakness (generalized)     Problem List There are no active problems to display for this patient.   Myra Rude, PT 06/28/2017, 12:29 PM  [facility] [address], [place], [place], [address] Phone: [phone]   Fax:  [phone]  Name: DIEGO DELANCEY MRN: [id number] Date of Birth: 1951/07/10

## 2017-07-01 ENCOUNTER — Ambulatory Visit: Payer: PPO | Admitting: Physical Therapy

## 2017-07-01 ENCOUNTER — Encounter: Payer: Self-pay | Admitting: Physical Therapy

## 2017-07-01 DIAGNOSIS — R293 Abnormal posture: Secondary | ICD-10-CM

## 2017-07-01 DIAGNOSIS — M6281 Muscle weakness (generalized): Secondary | ICD-10-CM

## 2017-07-01 DIAGNOSIS — R2689 Other abnormalities of gait and mobility: Secondary | ICD-10-CM

## 2017-07-01 DIAGNOSIS — M5442 Lumbago with sciatica, left side: Secondary | ICD-10-CM | POA: Diagnosis not present

## 2017-07-01 NOTE — Therapy (Signed)
[facility] [address] [place], [place], [address] Phone: [phone]   Fax:  [phone]  Physical Therapy Treatment  Patient Details  Name: Carolyn Cantu MRN: [id number] Date of Birth: [birth year] Referring Provider: Dr. Jonathon Jordan  Encounter Date: 07/01/2017      PT End of Session - 07/01/17 1018    Visit Number 12   Number of Visits 12   Date for PT Re-Evaluation 07/05/17   Authorization Type [employer]- 10th visit gcodes   PT Start Time 1017   PT Stop Time 1122   PT Time Calculation (min) 65 min   Activity Tolerance Patient tolerated treatment well;No increased pain   Behavior During Therapy WFL for tasks assessed/performed      Past Medical History:  Diagnosis Date  . Anxiety current   due to diagnosis of breast mass  . Arthritis    right foot/wrist; neck  . Glaucoma, narrow-angle    had laser surg.; no current med.  . Mammogram abnormal 12/2011  . Neck pain    old injury from [facility]; difficulty turning head to the right    Past Surgical History:  Procedure Laterality Date  . MOUTH SURGERY  age 67    There were no vitals filed for this visit.      Subjective Assessment - 07/01/17 1023    Subjective L thoracic and lumbar paraspinals tight with left hip hike/lateral trunk lean to the left.  Worked on increased thoracic mobility with open book stretch and thoracic extension into mat   Pertinent History osteoporosis, 2013 Rt sciatica   Limitations Sitting;Standing;Walking;House hold activities   How long can you sit comfortably? "some days I can do it, some days I can't"   Diagnostic tests xrays: OA   Patient Stated Goals improve pain, move without pain, improve strength   Currently in Pain? Yes   Pain Orientation Left   Pain Descriptors / Indicators Sore   Pain Onset More than a month ago   Pain Frequency Constant   Multiple Pain Sites No                          [facility] Adult PT Treatment/Exercise - 07/01/17 0001      Lumbar Exercises: Supine   Large Ball Abdominal Isometric 15 reps;3 seconds  rolling ball in and out with breath   Other Supine Lumbar Exercises isometric hip extension, thoracic extenion     Lumbar Exercises: Sidelying   Other Sidelying Lumbar Exercises open book for thoracic extension with PT OP to stabiltze inferior joints of spine - 10x bilateral     Electrical Stimulation   Electrical Stimulation Location sacral region   Electrical Stimulation Action IFC   Electrical Stimulation Parameters to tolerance x 15 min   Electrical Stimulation Goals Pain     Manual Therapy   Soft tissue mobilization lumbar and thoracic parapsinals   Myofascial Release left glutes, thoracolumbar                PT Education - 07/01/17 1116    Education provided Yes   Education Details pball roll out and SLR   Person(s) Educated Patient   Methods Explanation;Verbal cues;Handout   Comprehension Verbalized understanding             PT Long Term Goals - 07/01/17 1021      PT LONG TERM GOAL #2   Title improve lumbar ROM to no greater than 50%  limited for improved function (07/05/17)   Baseline pt reports definitely standing straighter but unsure of how long   Time 6   Period Weeks   Status On-going     PT LONG TERM GOAL #3   Title demonstrate ability to amb > 500' without deviations for improved pain and function (07/05/17)   Time 6   Period Weeks   Status On-going     PT LONG TERM GOAL #4   Title report pain < 6/10 with activity for improved function (07/05/17)   Time 6   Period Weeks   Status On-going     PT LONG TERM GOAL #5   Title improve LLE strength to at least 4+/5 for improved functional mobility (07/05/17)   Time 6   Period Weeks   Status On-going               Plan - 07/01/17 1019    Clinical Impression Statement Patient able to tolerate exercises on mat today  without increased pain but still fatigues quickly.  No porgress to goals yet, but patient feels overall improvement.  She conitinues to have pelvic asymmetry with left hip hike.  PT needed to address postural strength and endurance with improved postrue during functional movement to prevent re-occurance of increased symptoms.   PT Treatment/Interventions ADLs/Self Care Home Management;Cryotherapy;Electrical Stimulation;Moist Heat;Ultrasound;Traction;Neuromuscular re-education;Therapeutic exercise;Therapeutic activities;Functional mobility training;Gait training;Patient/family education;Manual techniques;Taping;Dry needling   PT Next Visit Plan review goals. review posture, check pelvic alignment, core and hip strengthening, strengthening in standing positions, sit to stand and gait   Consulted and Agree with Plan of Care Patient      Patient will benefit from skilled therapeutic intervention in order to improve the following deficits and impairments:  Decreased activity tolerance, Decreased mobility, Decreased strength, Difficulty walking, Postural dysfunction, Abnormal gait, Pain, Increased fascial restricitons, Increased muscle spasms, Decreased range of motion  Visit Diagnosis: Acute left-sided low back pain with left-sided sciatica  Abnormal posture  Other abnormalities of gait and mobility  Muscle weakness (generalized)     Problem List There are no active problems to display for this patient.   Zannie Cove, PT 07/01/2017, 11:35 AM  [facility] [address], [place], [place], [address] Phone: [phone]   Fax:  [phone]  Name: Carolyn Cantu MRN: [id number] Date of Birth: 12-Feb-1951

## 2017-07-01 NOTE — Patient Instructions (Addendum)
   Rolling ball in and out with the breath - do not lift bottom like he is doing in the photo 10x in and out  Then, with legs straight up on ball, lift one leg at a time bracing abdominals and keeping pelvis stable 10x each side

## 2017-07-04 ENCOUNTER — Ambulatory Visit: Payer: PPO | Admitting: Physical Therapy

## 2017-07-04 ENCOUNTER — Encounter: Payer: Self-pay | Admitting: Physical Therapy

## 2017-07-04 DIAGNOSIS — M5442 Lumbago with sciatica, left side: Secondary | ICD-10-CM | POA: Diagnosis not present

## 2017-07-04 DIAGNOSIS — M6281 Muscle weakness (generalized): Secondary | ICD-10-CM

## 2017-07-04 DIAGNOSIS — R2689 Other abnormalities of gait and mobility: Secondary | ICD-10-CM

## 2017-07-04 DIAGNOSIS — R293 Abnormal posture: Secondary | ICD-10-CM

## 2017-07-04 NOTE — Addendum Note (Signed)
Addended by: Lovett Calender D on: 07/04/2017 01:41 PM   Modules accepted: Orders

## 2017-07-04 NOTE — Therapy (Signed)
[facility] [address], [place], [place], [address] Phone: [phone]   Fax:  [phone]  Physical Therapy Treatment  Patient Details  Name: Carolyn Cantu MRN: [id number] Date of Birth: Jun 25, 1951 Referring Provider: Dr. Jonathon Jordan  Encounter Date: 07/04/2017      PT End of Session - 07/04/17 1144    Visit Number 13   Date for PT Re-Evaluation 08/01/17   Authorization Type [employer]- 10th visit gcodes   PT Start Time 7408   PT Stop Time 1230   PT Time Calculation (min) 45 min   Equipment Utilized During Treatment --   Activity Tolerance Patient tolerated treatment well;No increased pain   Behavior During Therapy WFL for tasks assessed/performed      Past Medical History:  Diagnosis Date  . Anxiety current   due to diagnosis of breast mass  . Arthritis    right foot/wrist; neck  . Glaucoma, narrow-angle    had laser surg.; no current med.  . Mammogram abnormal 12/2011  . Neck pain    old injury from [facility]; difficulty turning head to the right    Past Surgical History:  Procedure Laterality Date  . MOUTH SURGERY  age 33    There were no vitals filed for this visit.      Subjective Assessment - 07/04/17 1327    Subjective I am feeling better overall and feel like I can do a lot more of the exercises.   Pertinent History osteoporosis, 2013 Rt sciatica   Limitations Sitting;Standing;Walking;House hold activities   Patient Stated Goals improve pain, move without pain, improve strength   Currently in Pain? No/denies            [facility] PT Assessment - 07/04/17 0001      Observation/Other Assessments   Focus on Therapeutic Outcomes (FOTO)  56%                     [facility] Adult PT Treatment/Exercise - 07/04/17 0001      Lumbar Exercises: Standing   Shoulder Extension Strengthening;Both;15 reps;Theraband   Theraband Level (Shoulder Extension) Level 1 (Yellow)     Lumbar Exercises: Supine   Large Ball Abdominal Isometric 15 reps;3 seconds  rolling ball in and out with breath   Other Supine Lumbar Exercises isometric hip extension, thoracic extenion   Other Supine Lumbar Exercises large ball lumbar rotation     Knee/Hip Exercises: Standing   Hip ADduction 10 reps   Hip Extension 10 reps     Manual Therapy   Soft tissue mobilization lumbar and thoracic parapsinals, left glutes                     PT Long Term Goals - 07/04/17 1154      PT LONG TERM GOAL #1   Title independent with advanced HEP and able to manage pain at home   Time 4   Period Weeks   Status Revised     PT LONG TERM GOAL #2   Title improve lumbar ROM to no greater than 50% limited for improved function (07/05/17)   Time 4   Period Weeks   Status On-going     PT LONG TERM GOAL #3   Title demonstrate ability to amb > 500' without deviations for improved pain and function (07/05/17)   Time 4   Period Weeks   Status On-going     PT LONG TERM GOAL #4  Title report pain < 6/10 with activity for improved function (07/05/17)   Time 4   Period Weeks   Status On-going     PT LONG TERM GOAL #5   Title improve LLE strength to at least 4+/5 for improved functional mobility (07/05/17)   Time 4   Period Weeks   Status On-going               Plan - 07/04/17 1202    Clinical Impression Statement Patient has been able to tolate more exercises since start of care.  She reports she can do exercises now and is standing up better.  She reports sometimes walking is easier, but not all the time.  Overall, pt has improved strength but continues to have stiffness and decreased lumbar ROM.  She will continue to benefit from skilled PT to manage pain and address impairments such as ROM and strength for improved function.   Rehab Potential Good   PT Frequency 2x / week   PT Duration 4 weeks   PT Treatment/Interventions ADLs/Self Care Home  Management;Cryotherapy;Electrical Stimulation;Moist Heat;Ultrasound;Traction;Neuromuscular re-education;Therapeutic exercise;Therapeutic activities;Functional mobility training;Gait training;Patient/family education;Manual techniques;Taping;Dry needling   PT Next Visit Plan core and hip strength as tolerated and ROM; pain management   PT Home Exercise Plan --   Consulted and Agree with Plan of Care Patient      Patient will benefit from skilled therapeutic intervention in order to improve the following deficits and impairments:  Decreased activity tolerance, Decreased mobility, Decreased strength, Difficulty walking, Postural dysfunction, Abnormal gait, Pain, Increased fascial restricitons, Increased muscle spasms, Decreased range of motion  Visit Diagnosis: Acute left-sided low back pain with left-sided sciatica  Abnormal posture  Other abnormalities of gait and mobility  Muscle weakness (generalized)     Problem List There are no active problems to display for this patient.   Zannie Cove, PT 07/04/2017, 1:38 PM  [facility] [address] [place], [place], [address] Phone: [phone]   Fax:  [phone]  Name: Carolyn Cantu MRN: [id number] Date of Birth: 1951-01-08

## 2017-07-19 ENCOUNTER — Encounter: Payer: PPO | Admitting: Physical Therapy

## 2017-07-21 ENCOUNTER — Ambulatory Visit: Payer: PPO | Attending: Family Medicine | Admitting: Physical Therapy

## 2017-07-21 ENCOUNTER — Encounter: Payer: Self-pay | Admitting: Physical Therapy

## 2017-07-21 DIAGNOSIS — M5442 Lumbago with sciatica, left side: Secondary | ICD-10-CM | POA: Diagnosis not present

## 2017-07-21 DIAGNOSIS — R2689 Other abnormalities of gait and mobility: Secondary | ICD-10-CM

## 2017-07-21 DIAGNOSIS — M6281 Muscle weakness (generalized): Secondary | ICD-10-CM

## 2017-07-21 DIAGNOSIS — R293 Abnormal posture: Secondary | ICD-10-CM

## 2017-07-21 NOTE — Patient Instructions (Signed)
   EXERCISE BALL - MARCHING  While seated on an exercise ball, slowly raise a foot up off the floor. Return and then lift the opposite side.   Maintain a stable and erect spine.   Try to move your legs only.  20x each    Swiss Ball Knee Extension  Sitting straight up in the middle of the ball, alternate the legs, kicking one foot out in front and then the other. 20x each side    Single Leg Kickbacks with Loop (don't use loop yet)  Place loop around ankles and rest belly and chest over ball. Kick one leg back and balance. 10x each side       2 above exercises can do while sitting on ball, keeping core engaged and spine erect 20x each

## 2017-07-21 NOTE — Therapy (Signed)
[facility] [address] [place], [place], [address] Phone: [phone]   Fax:  [phone]  Physical Therapy Treatment  Patient Details  Name: Carolyn Cantu MRN: [id number] Date of Birth: September 15, 1951 Referring Provider: Dr. Jonathon Jordan  Encounter Date: 07/21/2017      PT End of Session - 07/21/17 1150    Visit Number 14   Date for PT Re-Evaluation 08/01/17   Authorization Type [employer]- 10th visit gcodes   PT Start Time 1150   PT Stop Time 1230   PT Time Calculation (min) 40 min   Activity Tolerance Patient tolerated treatment well;No increased pain      Past Medical History:  Diagnosis Date  . Anxiety current   due to diagnosis of breast mass  . Arthritis    right foot/wrist; neck  . Glaucoma, narrow-angle    had laser surg.; no current med.  . Mammogram abnormal 12/2011  . Neck pain    old injury from [facility]; difficulty turning head to the right    Past Surgical History:  Procedure Laterality Date  . MOUTH SURGERY  age 11    There were no vitals filed for this visit.      Subjective Assessment - 07/21/17 1154    Subjective I felt the best in a long time on Wednesday last week.  Today it is okay.     Pertinent History osteoporosis, 2013 Rt sciatica   Limitations Sitting;Standing;Walking;House hold activities   Patient Stated Goals improve pain, move without pain, improve strength   Currently in Pain? Yes   Pain Score 7    Pain Location Back   Pain Orientation Left   Pain Descriptors / Indicators Sore   Pain Type Neuropathic pain;Chronic pain   Pain Radiating Towards down to the back of the knee   Pain Onset More than a month ago   Pain Frequency Constant   Aggravating Factors  sitting on certain chairs, walking in the store   Pain Relieving Factors heat and cold   Multiple Pain Sites No                         [facility] Adult PT Treatment/Exercise - 07/21/17  0001      Lumbar Exercises: Standing   Row Strengthening;Power tower;Both;20 reps   Row Limitations 15#   Shoulder Extension Strengthening;Power Tower;Both;20 reps   Shoulder Extension Limitations 1 plate   Other Standing Lumbar Exercises wall push -20x     Lumbar Exercises: Seated   Long Arc Quad on [place] Strengthening;Right;Left;20 reps   Hip Flexion on [place] Strengthening;Right;Left;20 reps   Other Seated Lumbar Exercises on ball, horizontal abduction and external rotation with posutre and ab contraction- 20x each ; drawing sword 10x each way     Knee/Hip Exercises: Aerobic   Nustep L2 x 6 min      Knee/Hip Exercises: Standing   Hip Flexion Stengthening;Right;Left;20 reps;Knee bent  keeping pelvis neutral   Hip Extension 10 reps;Stengthening;Both;Knee straight  upper body on pball                PT Education - 07/21/17 1233    Education provided Yes   Education Details sitting on pball LAQ, march, and posture with band    Person(s) Educated Patient   Methods Explanation;Demonstration;Verbal cues;Handout   Comprehension Verbalized understanding;Returned demonstration             PT Long Term Goals - 07/04/17 1154  PT LONG TERM GOAL #1   Title independent with advanced HEP and able to manage pain at home   Time 4   Period Weeks   Status Revised     PT LONG TERM GOAL #2   Title improve lumbar ROM to no greater than 50% limited for improved function (07/05/17)   Time 4   Period Weeks   Status On-going     PT LONG TERM GOAL #3   Title demonstrate ability to amb > 500' without deviations for improved pain and function (07/05/17)   Time 4   Period Weeks   Status On-going     PT LONG TERM GOAL #4   Title report pain < 6/10 with activity for improved function (07/05/17)   Time 4   Period Weeks   Status On-going     PT LONG TERM GOAL #5   Title improve LLE strength to at least 4+/5 for improved functional mobility (07/05/17)   Time 4   Period Weeks    Status On-going               Plan - 07/21/17 1216    Clinical Impression Statement Patient tolerated exercises today and was able to perform exercises correctly.  Had some mild increased pain with left hip flexion and extension in standing exercises.  Pt will benefit from continue to add to core strength for improved psoture and functional movments.   Rehab Potential Good   PT Treatment/Interventions ADLs/Self Care Home Management;Cryotherapy;Electrical Stimulation;Moist Heat;Ultrasound;Traction;Neuromuscular re-education;Therapeutic exercise;Therapeutic activities;Functional mobility training;Gait training;Patient/family education;Manual techniques;Taping;Dry needling   PT Next Visit Plan core and hip strength as tolerated and ROM; pain management   Consulted and Agree with Plan of Care Patient      Patient will benefit from skilled therapeutic intervention in order to improve the following deficits and impairments:  Decreased activity tolerance, Decreased mobility, Decreased strength, Difficulty walking, Postural dysfunction, Abnormal gait, Pain, Increased fascial restricitons, Increased muscle spasms, Decreased range of motion  Visit Diagnosis: Acute left-sided low back pain with left-sided sciatica  Abnormal posture  Other abnormalities of gait and mobility  Muscle weakness (generalized)     Problem List There are no active problems to display for this patient.   Zannie Cove, PT 07/21/2017, 12:39 PM  [facility] [address], [place], [place], [address] Phone: [phone]   Fax:  [phone]  Name: Carolyn Cantu MRN: [id number] Date of Birth: 1951-06-07

## 2017-07-25 ENCOUNTER — Ambulatory Visit: Payer: PPO | Admitting: Physical Therapy

## 2017-07-25 ENCOUNTER — Encounter: Payer: Self-pay | Admitting: Physical Therapy

## 2017-07-25 DIAGNOSIS — M5442 Lumbago with sciatica, left side: Secondary | ICD-10-CM

## 2017-07-25 DIAGNOSIS — M6281 Muscle weakness (generalized): Secondary | ICD-10-CM

## 2017-07-25 DIAGNOSIS — R2689 Other abnormalities of gait and mobility: Secondary | ICD-10-CM

## 2017-07-25 DIAGNOSIS — R293 Abnormal posture: Secondary | ICD-10-CM

## 2017-07-25 NOTE — Therapy (Signed)
[facility] [address] [place], [place], [address] Phone: [phone]   Fax:  [phone]  Physical Therapy Treatment  Patient Details  Name: Carolyn Cantu MRN: [id number] Date of Birth: Apr 10, 1951 Referring Provider: Dr. Jonathon Jordan  Encounter Date: 07/25/2017      PT End of Session - 07/25/17 1147    Visit Number 15   Date for PT Re-Evaluation 08/01/17   Authorization Type [employer]- 10th visit gcodes   PT Start Time 5916   PT Stop Time 1225   PT Time Calculation (min) 40 min   Activity Tolerance Patient tolerated treatment well;No increased pain   Behavior During Therapy WFL for tasks assessed/performed      Past Medical History:  Diagnosis Date  . Anxiety current   due to diagnosis of breast mass  . Arthritis    right foot/wrist; neck  . Glaucoma, narrow-angle    had laser surg.; no current med.  . Mammogram abnormal 12/2011  . Neck pain    old injury from [facility]; difficulty turning head to the right    Past Surgical History:  Procedure Laterality Date  . MOUTH SURGERY  age 37    There were no vitals filed for this visit.      Subjective Assessment - 07/25/17 1149    Subjective I have discomfort this morning in my lower back.    Pertinent History osteoporosis, 2013 Rt sciatica   Limitations Sitting;Standing;Walking;House hold activities   Diagnostic tests xrays: OA   Patient Stated Goals improve pain, move without pain, improve strength   Currently in Pain? Yes   Pain Score 3    Pain Location Back   Pain Orientation Lower   Pain Descriptors / Indicators Discomfort   Multiple Pain Sites No                         [facility] Adult PT Treatment/Exercise - 07/25/17 0001      Lumbar Exercises: Standing   Row Strengthening;Power tower;Both;20 reps   Row Limitations 15#  VC for posture   Shoulder Extension Strengthening;Power Tower;Both;20 reps   Shoulder  Extension Limitations 1 plate  VC for core      Lumbar Exercises: Seated   Hip Flexion on Ball Strengthening;Right;Left;20 reps   Sit to Stand Limitations Seated core activation 10x   on ball with pillow squeeze 10x   Other Seated Lumbar Exercises on ball, horizontal abduction and external rotation with posutre and ab contraction- 20x each ; drawing sword 10x each way  including pelvic rocking, yellow band for D2 flexion,     Lumbar Exercises: Supine   Bridge 20 reps   Other Supine Lumbar Exercises decompression including leg lengthener     Knee/Hip Exercises: Aerobic   Nustep L2 x 8 min                      PT Long Term Goals - 07/04/17 1154      PT LONG TERM GOAL #1   Title independent with advanced HEP and able to manage pain at home   Time 4   Period Weeks   Status Revised     PT LONG TERM GOAL #2   Title improve lumbar ROM to no greater than 50% limited for improved function (07/05/17)   Time 4   Period Weeks   Status On-going     PT LONG TERM GOAL #3   Title demonstrate  ability to amb > 500' without deviations for improved pain and function (07/05/17)   Time 4   Period Weeks   Status On-going     PT LONG TERM GOAL #4   Title report pain < 6/10 with activity for improved function (07/05/17)   Time 4   Period Weeks   Status On-going     PT LONG TERM GOAL #5   Title improve LLE strength to at least 4+/5 for improved functional mobility (07/05/17)   Time 4   Period Weeks   Status On-going               Plan - 07/25/17 1148    Clinical Impression Statement Pt has difficulty maintaining upright posture when doing standing & seated exercises. She had to stand often during seated exercises as the back of her left hurt had some pain, but otherwise she tolerated all her exercises well.    Rehab Potential Good   PT Frequency 2x / week   PT Duration 4 weeks   PT Treatment/Interventions ADLs/Self Care Home Management;Cryotherapy;Electrical  Stimulation;Moist Heat;Ultrasound;Traction;Neuromuscular re-education;Therapeutic exercise;Therapeutic activities;Functional mobility training;Gait training;Patient/family education;Manual techniques;Taping;Dry needling   PT Next Visit Plan core and hip strength as tolerated and ROM; pain management   Consulted and Agree with Plan of Care Patient      Patient will benefit from skilled therapeutic intervention in order to improve the following deficits and impairments:  Decreased activity tolerance, Decreased mobility, Decreased strength, Difficulty walking, Postural dysfunction, Abnormal gait, Pain, Increased fascial restricitons, Increased muscle spasms, Decreased range of motion  Visit Diagnosis: Acute left-sided low back pain with left-sided sciatica  Abnormal posture  Other abnormalities of gait and mobility  Muscle weakness (generalized)     Problem List There are no active problems to display for this patient.   COCHRAN,JENNIFER, PTA 07/25/2017, 2:02 PM  [facility] [address], [place], [place], [address] Phone: [phone]   Fax:  [phone]  Name: Carolyn Cantu MRN: [id number] Date of Birth: 1951/09/25

## 2017-07-28 ENCOUNTER — Ambulatory Visit: Payer: PPO | Admitting: Physical Therapy

## 2017-07-28 DIAGNOSIS — R2689 Other abnormalities of gait and mobility: Secondary | ICD-10-CM

## 2017-07-28 DIAGNOSIS — M5442 Lumbago with sciatica, left side: Secondary | ICD-10-CM | POA: Diagnosis not present

## 2017-07-28 DIAGNOSIS — R293 Abnormal posture: Secondary | ICD-10-CM

## 2017-07-28 DIAGNOSIS — M6281 Muscle weakness (generalized): Secondary | ICD-10-CM

## 2017-07-28 NOTE — Therapy (Signed)
[facility] [address] [place], [place], [address] Phone: [phone]   Fax:  [phone]  Physical Therapy Treatment  Patient Details  Name: Carolyn Cantu MRN: [id number] Date of Birth: 1951-10-31 Referring Provider: Dr. Jonathon Jordan  Encounter Date: 07/28/2017      PT End of Session - 07/28/17 0931    Visit Number 16   Number of Visits 20   Date for PT Re-Evaluation 08/01/17   Authorization Type [employer]- 20th visit gcodes   PT Start Time 0930   PT Stop Time 1015   PT Time Calculation (min) 45 min   Activity Tolerance Patient tolerated treatment well;No increased pain   Behavior During Therapy WFL for tasks assessed/performed      Past Medical History:  Diagnosis Date  . Anxiety current   due to diagnosis of breast mass  . Arthritis    right foot/wrist; neck  . Glaucoma, narrow-angle    had laser surg.; no current med.  . Mammogram abnormal 12/2011  . Neck pain    old injury from [facility]; difficulty turning head to the right    Past Surgical History:  Procedure Laterality Date  . MOUTH SURGERY  age 66    There were no vitals filed for this visit.      Subjective Assessment - 07/28/17 1016    Subjective I forgot some of the things we did last time.  I am doing better with my exercises   Limitations Sitting;Standing;Walking;House hold activities   Patient Stated Goals improve pain, move without pain, improve strength   Currently in Pain? Yes   Pain Score 3    Pain Location Back   Pain Orientation Lower   Pain Descriptors / Indicators Discomfort   Pain Type Neuropathic pain   Pain Onset More than a month ago   Pain Frequency Constant                         [facility] Adult PT Treatment/Exercise - 07/28/17 0001      Lumbar Exercises: Standing   Row Strengthening;Power tower;Both;20 reps   Row Limitations 15#   VC    Shoulder Extension Strengthening;Power  Tower;Both;20 reps   Shoulder Extension Limitations 1 plate     Lumbar Exercises: Seated   Long Arc Quad on [place] Strengthening;Right;Left;20 reps   Hip Flexion on [employer] Strengthening;Right;Left;20 reps   Sit to Stand Limitations Seated core activation 10x   on ball with pillow squeeze 10x   Other Seated Lumbar Exercises on ball, horizontal abduction and external rotation with posutre and ab contraction- 20x each ; drawing sword 10x each way  including pelvic rocking, yellow band for D2 flexion,     Lumbar Exercises: Supine   Bridge 20 reps   Large Ball Abdominal Isometric 15 reps  ball flexion/extension   Other Supine Lumbar Exercises decompression including leg lengthener     Knee/Hip Exercises: Aerobic   Nustep L2 x 11 min      Knee/Hip Exercises: Sidelying   Hip ABduction Strengthening;Right;Left;10 reps   Clams 15x each side  yellow band                     PT Long Term Goals - 07/28/17 7672      PT LONG TERM GOAL #1   Title independent with advanced HEP and able to manage pain at home   Time 4   Period Weeks   Status  On-going     PT LONG TERM GOAL #2   Title improve lumbar ROM to no greater than 50% limited for improved function (07/05/17)   Time 4   Period Weeks   Status On-going     PT LONG TERM GOAL #3   Title demonstrate ability to amb > 500' without deviations for improved pain and function (07/05/17)   Baseline I went to [employer] and spent 15 minutes with using the basket   Time 4   Period Weeks   Status On-going     PT LONG TERM GOAL #4   Title report pain < 6/10 with activity for improved function (07/05/17)   Time 4   Period Weeks   Status On-going     PT LONG TERM GOAL #5   Title improve LLE strength to at least 4+/5 for improved functional mobility (07/05/17)   Time 4   Period Weeks   Status On-going               Plan - 07/28/17 1003    Clinical Impression Statement Patient has difficulty using lunge to bend down.  Pt is  able to perform several times, but had increased pain.  Pt will benefit from continued strength of pelvic/hip and core strength.  Pt demonstrates improved strength is able to tolerate more strengthning . Pt will benefit from skilled PT to work on strength and finalize HEP to ensure successful discharge.   PT Treatment/Interventions ADLs/Self Care Home Management;Cryotherapy;Electrical Stimulation;Moist Heat;Ultrasound;Traction;Neuromuscular re-education;Therapeutic exercise;Therapeutic activities;Functional mobility training;Gait training;Patient/family education;Manual techniques;Taping;Dry needling   PT Next Visit Plan core and hip strength as tolerated and ROM; pain management   PT Home Exercise Plan add things with bands and ball as needed   Consulted and Agree with Plan of Care Patient      Patient will benefit from skilled therapeutic intervention in order to improve the following deficits and impairments:  Decreased activity tolerance, Decreased mobility, Decreased strength, Difficulty walking, Postural dysfunction, Abnormal gait, Pain, Increased fascial restricitons, Increased muscle spasms, Decreased range of motion  Visit Diagnosis: Acute left-sided low back pain with left-sided sciatica  Abnormal posture  Other abnormalities of gait and mobility  Muscle weakness (generalized)     Problem List There are no active problems to display for this patient.   Zannie Cove, PT 07/28/2017, 10:17 AM  [facility] [address] [place], [place], [address] Phone: [phone]   Fax:  [phone]  Name: DANASIA BAKER MRN: [id number] Date of Birth: 12-10-1951

## 2017-08-02 ENCOUNTER — Ambulatory Visit: Payer: PPO | Admitting: Physical Therapy

## 2017-08-02 ENCOUNTER — Encounter: Payer: Self-pay | Admitting: Physical Therapy

## 2017-08-02 DIAGNOSIS — M5442 Lumbago with sciatica, left side: Secondary | ICD-10-CM

## 2017-08-02 DIAGNOSIS — R293 Abnormal posture: Secondary | ICD-10-CM

## 2017-08-02 DIAGNOSIS — R2689 Other abnormalities of gait and mobility: Secondary | ICD-10-CM

## 2017-08-02 DIAGNOSIS — M6281 Muscle weakness (generalized): Secondary | ICD-10-CM

## 2017-08-02 NOTE — Therapy (Signed)
[facility] [address] [place], [place], [address] Phone: [phone]   Fax:  [phone]  Physical Therapy Treatment  Patient Details  Name: Carolyn Cantu MRN: [id number] Date of Birth: 1951-06-16 Referring Provider: Dr. Jonathon Jordan  Encounter Date: 08/02/2017      PT End of Session - 08/02/17 1238    Visit Number 67   Number of Visits 20   Date for PT Re-Evaluation 09/13/17   Authorization Type [employer]- 20th visit gcodes   PT Start Time 1232   PT Stop Time 3664   PT Time Calculation (min) 41 min   Activity Tolerance Patient tolerated treatment well;No increased pain   Behavior During Therapy WFL for tasks assessed/performed      Past Medical History:  Diagnosis Date  . Anxiety current   due to diagnosis of breast mass  . Arthritis    right foot/wrist; neck  . Glaucoma, narrow-angle    had laser surg.; no current med.  . Mammogram abnormal 12/2011  . Neck pain    old injury from [facility]; difficulty turning head to the right    Past Surgical History:  Procedure Laterality Date  . MOUTH SURGERY  age 66    There were no vitals filed for this visit.      Subjective Assessment - 08/02/17 1240    Subjective I had to sit for a while and that was not comfortable and then didn't sleep well.  I am overall better and able to do a lot more exercises on the ball, standing, and bridges.   Pertinent History osteoporosis, 2013 Rt sciatica   Limitations Sitting;Standing;Walking;House hold activities   How long can you sit comfortably? "some days I can do it, some days I can't"   Diagnostic tests xrays: OA   Patient Stated Goals improve pain, move without pain, improve strength   Currently in Pain? Yes   Pain Score 3    Pain Location Back   Pain Orientation Lower   Pain Descriptors / Indicators Discomfort   Pain Type Neuropathic pain   Pain Radiating Towards down to the back of the knee   Pain Onset More than a month ago   Aggravating Factors  sitting on certain chairs   Pain Relieving Factors sitting on the nustep is helping   Multiple Pain Sites No                         [facility] Adult PT Treatment/Exercise - 08/02/17 0001      Neuro Re-ed    Neuro Re-ed Details  all exercises with focus on abdominal contraction and working on not hiking Lt hip     Lumbar Exercises: Stretches   Active Hamstring Stretch 3 reps;20 seconds   Piriformis Stretch 3 reps;20 seconds     Lumbar Exercises: Seated   Long Arc Quad on [place] Strengthening;Right;Left;20 reps   LAQ on [employer] (lbs) 2   Hip Flexion on [place] Strengthening;Right;Left;20 reps   Hip Flexion on [place] Limitations 2   Other Seated Lumbar Exercises seated on ball - rows 15# 3x10 with ab bracing     Lumbar Exercises: Supine   Straight Leg Raise 20 reps  bilateral     Knee/Hip Exercises: Aerobic   Nustep L2 x 10 min   PT present to discuss progress     Knee/Hip Exercises: Standing   Forward Lunges 5 reps  Rt side only, increase pain with Lt  Forward Step Up Hand Hold: 0;Step Height: 4";10 reps  began to cause increase Lt LE pain   Functional Squat 10 reps  began to cause increased leg pain Lt side                     PT Long Term Goals - 08/02/17 1246      PT LONG TERM GOAL #1   Title independent with advanced HEP and able to manage pain at home   Time 6   Period Weeks   Status Revised   Target Date 09/13/17     PT LONG TERM GOAL #2   Title improve lumbar ROM to no greater than 50% limited for improved function   Baseline 50% limited   Time 6   Period Weeks   Status Achieved   Target Date 09/13/17     PT LONG TERM GOAL #3   Title demonstrate ability to amb > 15 minutes without stopping due to pain in order to be able to get all grocery shopping done at one time   Time 6   Period Weeks   Status Revised   Target Date 09/13/17     PT LONG TERM GOAL #4   Title report  pain < 8/10 with all normal activities for improved function   Baseline sometimes up to 10/10 but only when walking more than I should or when I have to sit at a meeting   Time 6   Period Weeks   Status Revised   Target Date 09/13/17     PT LONG TERM GOAL #5   Title improve LLE strength to at least 4+/5 for improved functional mobility   Baseline shows improvedment from 3+/5 now 4-/5 to 5/5   Time 6   Period Weeks   Status Revised   Target Date 09/13/17               Plan - 08/02/17 1238    Clinical Impression Statement Patient is demonstrating slowly progressing towards goal and met one of her long term goals for increased lumbar ROM.  She is able to tolerate more exercises in PT and not needing manual.  She will benefit from more appointments at this time due to showing progress with increased difficutly of exercises.  She was also unable to come in for 2 weeks due to the schedule not being conducive.  She is able to walk a little more but does have some days where the pain is more and unable to walk as far.  Pt will benefit from skilled PT for improved core and postural strength for improved function.   PT Frequency 1x / week   PT Duration 6 weeks   PT Treatment/Interventions ADLs/Self Care Home Management;Cryotherapy;Electrical Stimulation;Moist Heat;Ultrasound;Traction;Neuromuscular re-education;Therapeutic exercise;Therapeutic activities;Functional mobility training;Gait training;Patient/family education;Manual techniques;Taping;Dry needling   PT Next Visit Plan core and hip strength as tolerated and ROM; pain management   PT Home Exercise Plan add things with bands and ball as needed   Consulted and Agree with Plan of Care Patient      Patient will benefit from skilled therapeutic intervention in order to improve the following deficits and impairments:  Decreased activity tolerance, Decreased mobility, Decreased strength, Difficulty walking, Postural dysfunction, Abnormal  gait, Pain, Increased fascial restricitons, Increased muscle spasms, Decreased range of motion  Visit Diagnosis: Acute left-sided low back pain with left-sided sciatica  Abnormal posture  Other abnormalities of gait and mobility  Muscle weakness (generalized)  Problem List There are no active problems to display for this patient.   Zannie Cove, PT 08/02/2017, 1:30 PM  [facility] [address], [place], [place], [address] Phone: [phone]   Fax:  [phone]  Name: EASTER KENNEBREW MRN: [id number] Date of Birth: 1951/05/30

## 2017-08-09 ENCOUNTER — Ambulatory Visit: Payer: PPO | Admitting: Physical Therapy

## 2017-08-09 ENCOUNTER — Encounter: Payer: Self-pay | Admitting: Physical Therapy

## 2017-08-09 DIAGNOSIS — R2689 Other abnormalities of gait and mobility: Secondary | ICD-10-CM

## 2017-08-09 DIAGNOSIS — M5442 Lumbago with sciatica, left side: Secondary | ICD-10-CM

## 2017-08-09 DIAGNOSIS — R293 Abnormal posture: Secondary | ICD-10-CM

## 2017-08-09 DIAGNOSIS — M6281 Muscle weakness (generalized): Secondary | ICD-10-CM

## 2017-08-09 NOTE — Patient Instructions (Signed)
   EXERCISE BALL - SUPINE TRUNK ROTATION  While lying on your back with an exercise ball under your lower legs, slowly roll the ball side to side to gently rotate your back.     Open Book  Lying on your side, arms straight out in front of you. Keeping arms straight, move top arm away from bottom and try to reach back of hand to opposite side of mat while keeping lower half still (legs don't move).     Legs on ball and do band exercises with your arms using the band You can keep legs resting on ball and core engaged, you can add leg marching as shown in picture

## 2017-08-09 NOTE — Therapy (Signed)
[facility] [address], [place], [place], [address] Phone: [phone]   Fax:  [phone]  Physical Therapy Treatment  Patient Details  Name: Carolyn Cantu MRN: [id number] Date of Birth: [birth year] Referring Provider: Dr. Jonathon Jordan  Encounter Date: 08/09/2017      PT End of Session - 08/09/17 1240    Visit Number 18   Number of Visits 20   Date for PT Re-Evaluation 09/13/17   Authorization Type [employer]- 20th visit gcodes   PT Start Time 1238   PT Stop Time 1318   PT Time Calculation (min) 40 min   Activity Tolerance Patient tolerated treatment well;No increased pain   Behavior During Therapy WFL for tasks assessed/performed      Past Medical History:  Diagnosis Date  . Anxiety current   due to diagnosis of breast mass  . Arthritis    right foot/wrist; neck  . Glaucoma, narrow-angle    had laser surg.; no current med.  . Mammogram abnormal 12/2011  . Neck pain    old injury from [facility]; difficulty turning head to the right    Past Surgical History:  Procedure Laterality Date  . MOUTH SURGERY  age 66    There were no vitals filed for this visit.      Subjective Assessment - 08/09/17 1247    Subjective I walked in the pool today and feeling a little sore from that, but glad I was able to do it.   Limitations Sitting;Standing;Walking;House hold activities   How long can you sit comfortably? "some days I can do it, some days I can't"   Diagnostic tests xrays: OA   Patient Stated Goals improve pain, move without pain, improve strength   Currently in Pain? Yes   Pain Score 7    Pain Location Back   Pain Orientation Lower   Pain Descriptors / Indicators Discomfort   Pain Type Neuropathic pain   Pain Radiating Towards down back leg to knee   Pain Onset More than a month ago   Pain Frequency Constant   Aggravating Factors  exercises, walking, standing   Pain Relieving Factors  lying down, rest   Multiple Pain Sites No                         [facility] Adult PT Treatment/Exercise - 08/09/17 0001      Lumbar Exercises: Standing   Other Standing Lumbar Exercises thoracic rotation in front of the mirror   Other Standing Lumbar Exercises side stepping 15# - 10x each way     Lumbar Exercises: Seated   Long Arc Quad on [place] Strengthening;Right;Left;20 reps   LAQ on [employer] (lbs) 2   Hip Flexion on [place] Strengthening;Right;Left;20 reps   Hip Flexion on [place] Limitations 2     Lumbar Exercises: Supine   Straight Leg Raise 20 reps  bilateral   Large Ball Abdominal Isometric 15 reps  ball flexion/extension   Large Ball Oblique Isometric 10 reps   Other Supine Lumbar Exercises open book stretch                PT Education - 08/09/17 1323    Education provided Yes   Education Details HEP seen in chart   Person(s) Educated Patient   Methods Explanation;Demonstration;Verbal cues;Tactile cues;Handout   Comprehension Verbalized understanding;Returned demonstration             PT Long Term Goals - 08/09/17  [place] #1   Title independent with advanced HEP and able to manage pain at home   Time 6   Period Weeks   Status On-going               Plan - 08/09/17 1240    Clinical Impression Statement Patient is having pain down back of leg.  She came from the pool and felt like the side step to Left and forward was causing some increased pain.  Pt continues to benefit from skilled PT for core and LE strengthing in order to maintain immproved pelvic alignment during functional activities in order to work towards maximum function.   Rehab Potential Good   PT Treatment/Interventions ADLs/Self Care Home Management;Cryotherapy;Electrical Stimulation;Moist Heat;Ultrasound;Traction;Neuromuscular re-education;Therapeutic exercise;Therapeutic activities;Functional mobility training;Gait training;Patient/family  education;Manual techniques;Taping;Dry needling   PT Next Visit Plan core and hip strength as tolerated and ROM; pain management   Consulted and Agree with Plan of Care Patient      Patient will benefit from skilled therapeutic intervention in order to improve the following deficits and impairments:  Decreased activity tolerance, Decreased mobility, Decreased strength, Difficulty walking, Postural dysfunction, Abnormal gait, Pain, Increased fascial restricitons, Increased muscle spasms, Decreased range of motion  Visit Diagnosis: Acute left-sided low back pain with left-sided sciatica  Abnormal posture  Other abnormalities of gait and mobility  Muscle weakness (generalized)     Problem List There are no active problems to display for this patient.   Zannie Cove, PT 08/09/2017, 1:31 PM  [facility] [address], [place], [place], [address] Phone: [phone]   Fax:  [phone]  Name: CARIANA KARGE MRN: [id number] Date of Birth: 09/18/1951

## 2017-08-17 ENCOUNTER — Encounter: Payer: Self-pay | Admitting: Physical Therapy

## 2017-08-17 ENCOUNTER — Ambulatory Visit: Payer: PPO | Attending: Family Medicine | Admitting: Physical Therapy

## 2017-08-17 DIAGNOSIS — M6281 Muscle weakness (generalized): Secondary | ICD-10-CM | POA: Diagnosis not present

## 2017-08-17 DIAGNOSIS — M5442 Lumbago with sciatica, left side: Secondary | ICD-10-CM | POA: Diagnosis not present

## 2017-08-17 DIAGNOSIS — R2689 Other abnormalities of gait and mobility: Secondary | ICD-10-CM | POA: Diagnosis not present

## 2017-08-17 DIAGNOSIS — R293 Abnormal posture: Secondary | ICD-10-CM | POA: Insufficient documentation

## 2017-08-17 NOTE — Therapy (Signed)
[facility] [address] [place], [place], [address] Phone: [phone]   Fax:  [phone]  Physical Therapy Treatment  Patient Details  Name: Carolyn Cantu MRN: [id number] Date of Birth: June 25, 1951 Referring Provider: Dr. Jonathon Jordan  Encounter Date: 08/17/2017      PT End of Session - 08/17/17 1624    Visit Number 19   Number of Visits 20   Date for PT Re-Evaluation 09/13/17   Authorization Type [employer]- 20th visit gcodes   PT Start Time 1620   PT Stop Time 1700   PT Time Calculation (min) 40 min   Activity Tolerance Patient tolerated treatment well;No increased pain   Behavior During Therapy WFL for tasks assessed/performed      Past Medical History:  Diagnosis Date  . Anxiety current   due to diagnosis of breast mass  . Arthritis    right foot/wrist; neck  . Glaucoma, narrow-angle    had laser surg.; no current med.  . Mammogram abnormal 12/2011  . Neck pain    old injury from [facility]; difficulty turning head to the right    Past Surgical History:  Procedure Laterality Date  . MOUTH SURGERY  age 18    There were no vitals filed for this visit.      Subjective Assessment - 08/17/17 1625    Subjective Swimming is really helping me feel good.    Pertinent History osteoporosis, 2013 Rt sciatica   Currently in Pain? Yes   Pain Score 2    Pain Location Back   Pain Orientation Right   Pain Descriptors / Indicators Dull   Aggravating Factors  overdoing it   Pain Relieving Factors rest, sitting   Multiple Pain Sites No                         [facility] Adult PT Treatment/Exercise - 08/17/17 0001      Lumbar Exercises: Stretches   Active Hamstring Stretch 2 reps;20 seconds     Lumbar Exercises: Aerobic   Stationary Bike Nustep L2 x 10 min     Lumbar Exercises: Seated   Sit to Stand --  On ball: red band horizontal abd 20x   Other Seated Lumbar Exercises  Pelvic rocking on ball: 2x 20  Biceps 3# 20x, 2# alt overhead shld press 20x     Lumbar Exercises: Supine   Straight Leg Raise 20 reps  bilateral   Large Ball Abdominal Isometric 15 reps  ball flexion/extension   Other Supine Lumbar Exercises hookyling 3 min decompression to end session     Lumbar Exercises: Sidelying   Clam 20 reps   Hip Abduction 20 reps     Knee/Hip Exercises: Standing   Walking with Sports Cord 15# forward 5x  VC to drive with legs                     PT Long Term Goals - 08/09/17 1330      PT LONG TERM GOAL #1   Title independent with advanced HEP and able to manage pain at home   Time 6   Period Weeks   Status On-going               Plan - 08/17/17 1624    Clinical Impression Statement Pt doing the pool 5x week and receiving massage therapy 1x week.  She feels the water exercise has been very beneficial.  Showed  pt some UE exercises seated on the ball and  pelvic rocking to add to home.    Rehab Potential Good   PT Frequency 1x / week   PT Duration 6 weeks   PT Treatment/Interventions ADLs/Self Care Home Management;Cryotherapy;Electrical Stimulation;Moist Heat;Ultrasound;Traction;Neuromuscular re-education;Therapeutic exercise;Therapeutic activities;Functional mobility training;Gait training;Patient/family education;Manual techniques;Taping;Dry needling   PT Next Visit Plan Possible early DC as pt is doing pool and HEP at home.    PT Home Exercise Plan add things with bands and ball as needed   Consulted and Agree with Plan of Care Patient      Patient will benefit from skilled therapeutic intervention in order to improve the following deficits and impairments:  Decreased activity tolerance, Decreased mobility, Decreased strength, Difficulty walking, Postural dysfunction, Abnormal gait, Pain, Increased fascial restricitons, Increased muscle spasms, Decreased range of motion  Visit Diagnosis: Acute left-sided low back pain with  left-sided sciatica  Abnormal posture  Other abnormalities of gait and mobility  Muscle weakness (generalized)     Problem List There are no active problems to display for this patient.   COCHRAN,JENNIFER, PTA 08/17/2017, 4:59 PM  [facility] [address] [place], [place], [address] Phone: [phone]   Fax:  [phone]  Name: GAILA ENGEBRETSEN MRN: [id number] Date of Birth: 1951-05-04

## 2017-08-23 ENCOUNTER — Encounter: Payer: Self-pay | Admitting: Physical Therapy

## 2017-08-23 ENCOUNTER — Ambulatory Visit: Payer: PPO | Admitting: Physical Therapy

## 2017-08-23 DIAGNOSIS — R2689 Other abnormalities of gait and mobility: Secondary | ICD-10-CM

## 2017-08-23 DIAGNOSIS — M6281 Muscle weakness (generalized): Secondary | ICD-10-CM

## 2017-08-23 DIAGNOSIS — M5442 Lumbago with sciatica, left side: Secondary | ICD-10-CM | POA: Diagnosis not present

## 2017-08-23 DIAGNOSIS — R293 Abnormal posture: Secondary | ICD-10-CM

## 2017-08-23 NOTE — Therapy (Addendum)
[facility] [address] [place], [place], [address] Phone: [phone]   Fax:  [phone]  Physical Therapy Treatment  Patient Details  Name: TROYCE FEBO MRN: [id number] Date of Birth: 11/07/1951 Referring Provider: Dr. Jonathon Jordan  Encounter Date: 08/23/2017      PT End of Session - 08/23/17 1438    Visit Number 20   Number of Visits 20   Date for PT Re-Evaluation 09/13/17   Authorization Type [employer]- 20th visit gcodes   PT Start Time 7915   PT Stop Time 1523   PT Time Calculation (min) 41 min   Activity Tolerance Patient tolerated treatment well;No increased pain   Behavior During Therapy WFL for tasks assessed/performed      Past Medical History:  Diagnosis Date  . Anxiety current   due to diagnosis of breast mass  . Arthritis    right foot/wrist; neck  . Glaucoma, narrow-angle    had laser surg.; no current med.  . Mammogram abnormal 12/2011  . Neck pain    old injury from [facility]; difficulty turning head to the right    Past Surgical History:  Procedure Laterality Date  . MOUTH SURGERY  age 23    There were no vitals filed for this visit.      Subjective Assessment - 08/23/17 1451    Subjective Feel like I am finally able to work out in the pool.  I feel like the therapy is helping.     Pertinent History osteoporosis, 2013 Rt sciatica   Limitations Sitting;Standing;Walking;House hold activities   How long can you sit comfortably? "some days I can do it, some days I can't"   Diagnostic tests xrays: OA   Patient Stated Goals improve pain, move without pain, improve strength   Currently in Pain? Yes   Pain Score 5    Pain Location Back   Pain Orientation Right   Pain Descriptors / Indicators Dull   Pain Type Neuropathic pain   Pain Onset More than a month ago   Pain Frequency Constant   Aggravating Factors  walking and stores   Pain Relieving Factors rest, lying  down, ice, TENS   Multiple Pain Sites No                         [facility] Adult PT Treatment/Exercise - 08/23/17 0001      Lumbar Exercises: Aerobic   Stationary Bike Nustep L2 x 14 min  PT present to discuss HEP and progress     Lumbar Exercises: Seated   Other Seated Lumbar Exercises seated on ball, circles for ROM, LE march and LAQ, power tower 20lb row 2 ways 20x     Lumbar Exercises: Supine   Other Supine Lumbar Exercises reviewed previous HEP, ball roll outs, rotation on ball    Other Supine Lumbar Exercises open book stretch 10x                     PT Long Term Goals - 08/23/17 1510      PT LONG TERM GOAL #1   Title independent with advanced HEP and able to manage pain at home   Time 6   Period Weeks   Status On-going     PT LONG TERM GOAL #2   Title improve lumbar ROM to no greater than 50% limited for improved function   Baseline 50% limited   Time 6  Period Weeks   Status Achieved     PT LONG TERM GOAL #3   Title demonstrate ability to amb > 15 minutes without stopping due to pain in order to be able to get all grocery shopping done at one time   Time 6   Period Weeks   Status Achieved     PT LONG TERM GOAL #4   Title report pain < 8/10 with all normal activities for improved function   Time 6   Period Weeks   Status Not Met               Plan - Aug 27, 2017 1437    Clinical Impression Statement Patient is doing well and needed review of HEP.  Pt does not feel ready to discharge today but PT discussed the need to continue with exercises and she is agreeable to d/c next visit to ensure she has no questions with HEP.  Functional outcome measure (FOTO) is down to 45% limited from 60%.    Rehab Potential Good   PT Treatment/Interventions ADLs/Self Care Home Management;Cryotherapy;Electrical Stimulation;Moist Heat;Ultrasound;Traction;Neuromuscular re-education;Therapeutic exercise;Therapeutic activities;Functional mobility  training;Gait training;Patient/family education;Manual techniques;Taping;Dry needling   PT Next Visit Plan plan on discharge next visit   PT Home Exercise Plan add things with bands and ball as needed   Consulted and Agree with Plan of Care Patient      Patient will benefit from skilled therapeutic intervention in order to improve the following deficits and impairments:  Decreased activity tolerance, Decreased mobility, Decreased strength, Difficulty walking, Postural dysfunction, Abnormal gait, Pain, Increased fascial restricitons, Increased muscle spasms, Decreased range of motion  Visit Diagnosis: Acute left-sided low back pain with left-sided sciatica  Abnormal posture  Other abnormalities of gait and mobility  Muscle weakness (generalized)       G-Codes - 08-27-17 1519    Functional Assessment Tool Used (Outpatient Only) FOTO and clnical assessment   Functional Limitation Mobility: Walking and moving around   Mobility: Walking and Moving Around Current Status [phone]) At least 40 percent but less than 60 percent impaired, limited or restricted   Mobility: Walking and Moving Around Goal Status [phone]) At least 60 percent but less than 80 percent impaired, limited or restricted    Discharge CK at least 40% limited  Problem List There are no active problems to display for this patient.   Zannie Cove, PT Aug 27, 2017, 3:27 PM  [facility] [address] [place], [place], [address] Phone: [phone]   Fax:  [phone]  Name: PERRIN EDDLEMAN MRN: [id number] Date of Birth: [birth year]  PHYSICAL THERAPY DISCHARGE SUMMARY  Visits from Start of Care: 20  Current functional level related to goals / functional outcomes: See above goals, pt called to discharge felt good with advanced HEP   Remaining deficits: See above   Education / Equipment: HEP Plan: Patient agrees to discharge.  Patient goals were  partially met. Patient is being discharged due to being pleased with the current functional level.  ?????         [employer], PT 09/01/17 10:10 AM

## 2017-08-30 ENCOUNTER — Ambulatory Visit: Payer: Self-pay | Admitting: Physical Therapy

## 2017-09-06 ENCOUNTER — Encounter: Payer: PPO | Admitting: Physical Therapy

## 2017-09-13 ENCOUNTER — Ambulatory Visit: Payer: PPO | Admitting: Physical Therapy

## 2017-10-26 ENCOUNTER — Other Ambulatory Visit: Payer: Self-pay | Admitting: Family Medicine

## 2017-10-26 DIAGNOSIS — Z1231 Encounter for screening mammogram for malignant neoplasm of breast: Secondary | ICD-10-CM

## 2017-12-05 ENCOUNTER — Ambulatory Visit: Payer: PPO

## 2017-12-27 ENCOUNTER — Ambulatory Visit
Admission: RE | Admit: 2017-12-27 | Discharge: 2017-12-27 | Disposition: A | Discharge status: Home / Self Care | Payer: PPO | Source: Ambulatory Visit | Attending: Family Medicine | Admitting: Family Medicine

## 2017-12-27 DIAGNOSIS — Z1231 Encounter for screening mammogram for malignant neoplasm of breast: Secondary | ICD-10-CM

## 2018-02-02 DIAGNOSIS — Z0000 Encounter for general adult medical examination without abnormal findings: Secondary | ICD-10-CM | POA: Diagnosis not present

## 2018-02-02 DIAGNOSIS — M810 Age-related osteoporosis without current pathological fracture: Secondary | ICD-10-CM | POA: Diagnosis not present

## 2018-02-02 DIAGNOSIS — Z136 Encounter for screening for cardiovascular disorders: Secondary | ICD-10-CM | POA: Diagnosis not present

## 2018-02-09 DIAGNOSIS — E559 Vitamin D deficiency, unspecified: Secondary | ICD-10-CM | POA: Diagnosis not present

## 2018-02-09 DIAGNOSIS — E2839 Other primary ovarian failure: Secondary | ICD-10-CM | POA: Diagnosis not present

## 2018-02-09 DIAGNOSIS — Z0000 Encounter for general adult medical examination without abnormal findings: Secondary | ICD-10-CM | POA: Diagnosis not present

## 2018-02-09 DIAGNOSIS — Z1389 Encounter for screening for other disorder: Secondary | ICD-10-CM | POA: Diagnosis not present

## 2018-02-09 DIAGNOSIS — M1990 Unspecified osteoarthritis, unspecified site: Secondary | ICD-10-CM | POA: Diagnosis not present

## 2018-04-04 DIAGNOSIS — E2839 Other primary ovarian failure: Secondary | ICD-10-CM | POA: Diagnosis not present

## 2018-04-04 DIAGNOSIS — M810 Age-related osteoporosis without current pathological fracture: Secondary | ICD-10-CM | POA: Diagnosis not present

## 2018-05-09 DIAGNOSIS — E559 Vitamin D deficiency, unspecified: Secondary | ICD-10-CM | POA: Diagnosis not present

## 2018-08-29 DIAGNOSIS — D1801 Hemangioma of skin and subcutaneous tissue: Secondary | ICD-10-CM | POA: Diagnosis not present

## 2018-08-29 DIAGNOSIS — D224 Melanocytic nevi of scalp and neck: Secondary | ICD-10-CM | POA: Diagnosis not present

## 2018-08-29 DIAGNOSIS — D2239 Melanocytic nevi of other parts of face: Secondary | ICD-10-CM | POA: Diagnosis not present

## 2018-08-29 DIAGNOSIS — L821 Other seborrheic keratosis: Secondary | ICD-10-CM | POA: Diagnosis not present

## 2018-08-29 DIAGNOSIS — D2271 Melanocytic nevi of right lower limb, including hip: Secondary | ICD-10-CM | POA: Diagnosis not present

## 2018-11-17 ENCOUNTER — Other Ambulatory Visit: Payer: Self-pay | Admitting: Family Medicine

## 2018-11-17 DIAGNOSIS — Z1231 Encounter for screening mammogram for malignant neoplasm of breast: Secondary | ICD-10-CM

## 2018-12-29 ENCOUNTER — Ambulatory Visit
Admission: RE | Admit: 2018-12-29 | Discharge: 2018-12-29 | Disposition: A | Discharge status: Home / Self Care | Payer: PPO | Source: Ambulatory Visit | Attending: Family Medicine | Admitting: Family Medicine

## 2018-12-29 DIAGNOSIS — Z1231 Encounter for screening mammogram for malignant neoplasm of breast: Secondary | ICD-10-CM

## 2019-05-08 DIAGNOSIS — Z0000 Encounter for general adult medical examination without abnormal findings: Secondary | ICD-10-CM | POA: Diagnosis not present

## 2019-05-08 DIAGNOSIS — F320 Major depressive disorder, single episode, mild: Secondary | ICD-10-CM | POA: Diagnosis not present

## 2019-05-08 DIAGNOSIS — H402290 Chronic angle-closure glaucoma, unspecified eye, stage unspecified: Secondary | ICD-10-CM | POA: Diagnosis not present

## 2019-05-08 DIAGNOSIS — M1990 Unspecified osteoarthritis, unspecified site: Secondary | ICD-10-CM | POA: Diagnosis not present

## 2019-05-08 DIAGNOSIS — E559 Vitamin D deficiency, unspecified: Secondary | ICD-10-CM | POA: Diagnosis not present

## 2019-05-08 DIAGNOSIS — M810 Age-related osteoporosis without current pathological fracture: Secondary | ICD-10-CM | POA: Diagnosis not present

## 2019-05-08 DIAGNOSIS — M2550 Pain in unspecified joint: Secondary | ICD-10-CM | POA: Diagnosis not present

## 2019-07-27 DIAGNOSIS — L438 Other lichen planus: Secondary | ICD-10-CM | POA: Diagnosis not present

## 2019-07-27 DIAGNOSIS — D2261 Melanocytic nevi of right upper limb, including shoulder: Secondary | ICD-10-CM | POA: Diagnosis not present

## 2019-07-27 DIAGNOSIS — L821 Other seborrheic keratosis: Secondary | ICD-10-CM | POA: Diagnosis not present

## 2019-07-27 DIAGNOSIS — D2262 Melanocytic nevi of left upper limb, including shoulder: Secondary | ICD-10-CM | POA: Diagnosis not present

## 2019-07-27 DIAGNOSIS — D2272 Melanocytic nevi of left lower limb, including hip: Secondary | ICD-10-CM | POA: Diagnosis not present

## 2019-07-27 DIAGNOSIS — D225 Melanocytic nevi of trunk: Secondary | ICD-10-CM | POA: Diagnosis not present

## 2019-07-27 DIAGNOSIS — D1801 Hemangioma of skin and subcutaneous tissue: Secondary | ICD-10-CM | POA: Diagnosis not present

## 2019-07-27 DIAGNOSIS — D2271 Melanocytic nevi of right lower limb, including hip: Secondary | ICD-10-CM | POA: Diagnosis not present

## 2020-02-01 ENCOUNTER — Other Ambulatory Visit: Payer: Self-pay | Admitting: Family Medicine

## 2020-02-01 DIAGNOSIS — Z1231 Encounter for screening mammogram for malignant neoplasm of breast: Secondary | ICD-10-CM

## 2020-02-18 DIAGNOSIS — H2513 Age-related nuclear cataract, bilateral: Secondary | ICD-10-CM | POA: Diagnosis not present

## 2020-03-07 ENCOUNTER — Ambulatory Visit: Payer: PPO

## 2020-03-26 ENCOUNTER — Ambulatory Visit
Admission: RE | Admit: 2020-03-26 | Discharge: 2020-03-26 | Disposition: A | Discharge status: Home / Self Care | Payer: PPO | Source: Ambulatory Visit | Attending: Family Medicine | Admitting: Family Medicine

## 2020-03-26 ENCOUNTER — Other Ambulatory Visit: Payer: Self-pay

## 2020-03-26 DIAGNOSIS — Z1231 Encounter for screening mammogram for malignant neoplasm of breast: Secondary | ICD-10-CM

## 2020-04-08 ENCOUNTER — Other Ambulatory Visit: Payer: Self-pay

## 2020-04-08 ENCOUNTER — Ambulatory Visit (INDEPENDENT_AMBULATORY_CARE_PROVIDER_SITE_OTHER): Payer: PPO

## 2020-04-08 ENCOUNTER — Other Ambulatory Visit: Payer: Self-pay | Admitting: Sports Medicine

## 2020-04-08 ENCOUNTER — Encounter: Payer: Self-pay | Admitting: Sports Medicine

## 2020-04-08 ENCOUNTER — Ambulatory Visit: Payer: PPO | Admitting: Sports Medicine

## 2020-04-08 VITALS — BP 139/59 | HR 64 | Temp 97.3°F | Resp 16

## 2020-04-08 DIAGNOSIS — M79671 Pain in right foot: Secondary | ICD-10-CM

## 2020-04-08 DIAGNOSIS — M79672 Pain in left foot: Secondary | ICD-10-CM | POA: Diagnosis not present

## 2020-04-08 DIAGNOSIS — M722 Plantar fascial fibromatosis: Secondary | ICD-10-CM | POA: Diagnosis not present

## 2020-04-08 DIAGNOSIS — M216X1 Other acquired deformities of right foot: Secondary | ICD-10-CM | POA: Diagnosis not present

## 2020-04-08 DIAGNOSIS — M216X2 Other acquired deformities of left foot: Secondary | ICD-10-CM | POA: Diagnosis not present

## 2020-04-08 DIAGNOSIS — G8929 Other chronic pain: Secondary | ICD-10-CM | POA: Diagnosis not present

## 2020-04-08 DIAGNOSIS — M729 Fibroblastic disorder, unspecified: Secondary | ICD-10-CM | POA: Diagnosis not present

## 2020-04-08 NOTE — Progress Notes (Signed)
Subjective: Carolyn Cantu is a 69 y.o. female patient who presents to office for evaluation for new orthotics. Patient states that orthotics have helped in the past and wants a new set. Reports that she had problems for years with pain after a accident in 20.  Patient denies any other pedal complaints at this time.   Review of Systems  All other systems reviewed and are negative.    There are no problems to display for this patient.   Current Outpatient Medications on File Prior to Visit  Medication Sig Dispense Refill  . Acetaminophen (TYLENOL PO) Take by mouth as needed.    . triamcinolone cream (KENALOG) 0.1 % Apply 1 application topically as needed.     No current facility-administered medications on file prior to visit.    Allergies  Allergen Reactions  . Paba Derivatives     Pt stated, "I am allergic to this in sunscreen lotion"  . Adhesive [Tape] Rash  . Latex Rash    Objective:  General: Alert and oriented x3 in no acute distress  Dermatology: No open lesions bilateral lower extremities, no webspace macerations, no ecchymosis bilateral, all nails x 10 are well manicured.  Vascular: Dorsalis Pedis and Posterior Tibial pedal pulses 1/4, Capillary Fill Time 3 seconds, + pedal hair growth bilateral, no edema bilateral lower extremities, Temperature gradient within normal limits.  Neurology: Gross sensation intact via light touch bilateral.  Musculoskeletal: Limited STJ bilateral and cavus foot type. Mild limited ankle range of motion right over left  Gait: Minimally-Antalgic, supported with use of orthosis however current pair is 70 years old.  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  Assessment and Plan: Problem List Items Addressed This Visit    None    Visit Diagnoses    Pain in both feet    -  Primary   Relevant Orders   DG Foot Complete Left (Completed)   Acquired bilateral pes cavus       Fasciitis        Chronic heel pain, right       Hx of fracture 1977   Relevant Medications   Acetaminophen (TYLENOL PO)      -Complete examination performed -Xrays reviewed -Patient was casted for a new set of custom functional foot orthosis by [facility] with good supportive shoes  -Patient to return to office PUO or sooner if condition worsens or problems arise.  Landis Martins, DPM

## 2020-04-29 ENCOUNTER — Ambulatory Visit: Payer: PPO | Admitting: Orthotics

## 2020-04-29 ENCOUNTER — Other Ambulatory Visit: Payer: Self-pay

## 2020-04-29 DIAGNOSIS — M216X1 Other acquired deformities of right foot: Secondary | ICD-10-CM

## 2020-04-29 DIAGNOSIS — M216X2 Other acquired deformities of left foot: Secondary | ICD-10-CM

## 2020-04-29 NOTE — Progress Notes (Signed)
Patient came in today to pick up custom made foot orthotics.  The goals were accomplished and the patient reported no dissatisfaction with said orthotics.  Patient was advised of breakin period and how to report any issues. 

## 2020-05-13 DIAGNOSIS — H40033 Anatomical narrow angle, bilateral: Secondary | ICD-10-CM | POA: Diagnosis not present

## 2020-05-13 DIAGNOSIS — H2513 Age-related nuclear cataract, bilateral: Secondary | ICD-10-CM | POA: Diagnosis not present

## 2020-06-06 DIAGNOSIS — Z01818 Encounter for other preprocedural examination: Secondary | ICD-10-CM | POA: Diagnosis not present

## 2020-06-06 DIAGNOSIS — H25812 Combined forms of age-related cataract, left eye: Secondary | ICD-10-CM | POA: Diagnosis not present

## 2020-06-12 DIAGNOSIS — H25812 Combined forms of age-related cataract, left eye: Secondary | ICD-10-CM | POA: Diagnosis not present

## 2020-06-12 DIAGNOSIS — H2512 Age-related nuclear cataract, left eye: Secondary | ICD-10-CM | POA: Diagnosis not present

## 2020-06-13 DIAGNOSIS — Z79899 Other long term (current) drug therapy: Secondary | ICD-10-CM | POA: Diagnosis not present

## 2020-06-13 DIAGNOSIS — H269 Unspecified cataract: Secondary | ICD-10-CM | POA: Diagnosis not present

## 2020-06-13 DIAGNOSIS — H2511 Age-related nuclear cataract, right eye: Secondary | ICD-10-CM | POA: Diagnosis not present

## 2020-06-13 DIAGNOSIS — Z48810 Encounter for surgical aftercare following surgery on the sense organs: Secondary | ICD-10-CM | POA: Diagnosis not present

## 2020-06-13 DIAGNOSIS — Z7952 Long term (current) use of systemic steroids: Secondary | ICD-10-CM | POA: Diagnosis not present

## 2020-06-13 DIAGNOSIS — Z961 Presence of intraocular lens: Secondary | ICD-10-CM | POA: Diagnosis not present

## 2020-06-27 DIAGNOSIS — Z79899 Other long term (current) drug therapy: Secondary | ICD-10-CM | POA: Diagnosis not present

## 2020-06-27 DIAGNOSIS — Z48810 Encounter for surgical aftercare following surgery on the sense organs: Secondary | ICD-10-CM | POA: Diagnosis not present

## 2020-06-27 DIAGNOSIS — H40033 Anatomical narrow angle, bilateral: Secondary | ICD-10-CM | POA: Diagnosis not present

## 2020-06-27 DIAGNOSIS — Z7952 Long term (current) use of systemic steroids: Secondary | ICD-10-CM | POA: Diagnosis not present

## 2020-06-27 DIAGNOSIS — H2511 Age-related nuclear cataract, right eye: Secondary | ICD-10-CM | POA: Diagnosis not present

## 2020-06-27 DIAGNOSIS — Z961 Presence of intraocular lens: Secondary | ICD-10-CM | POA: Diagnosis not present

## 2020-07-07 DIAGNOSIS — Z7952 Long term (current) use of systemic steroids: Secondary | ICD-10-CM | POA: Diagnosis not present

## 2020-07-07 DIAGNOSIS — H2511 Age-related nuclear cataract, right eye: Secondary | ICD-10-CM | POA: Diagnosis not present

## 2020-07-07 DIAGNOSIS — Z961 Presence of intraocular lens: Secondary | ICD-10-CM | POA: Diagnosis not present

## 2020-07-07 DIAGNOSIS — Z9842 Cataract extraction status, left eye: Secondary | ICD-10-CM | POA: Diagnosis not present

## 2020-07-24 DIAGNOSIS — H25811 Combined forms of age-related cataract, right eye: Secondary | ICD-10-CM | POA: Diagnosis not present

## 2020-07-24 DIAGNOSIS — H2511 Age-related nuclear cataract, right eye: Secondary | ICD-10-CM | POA: Diagnosis not present

## 2020-07-24 DIAGNOSIS — H52201 Unspecified astigmatism, right eye: Secondary | ICD-10-CM | POA: Diagnosis not present

## 2020-07-31 DIAGNOSIS — D2261 Melanocytic nevi of right upper limb, including shoulder: Secondary | ICD-10-CM | POA: Diagnosis not present

## 2020-07-31 DIAGNOSIS — L820 Inflamed seborrheic keratosis: Secondary | ICD-10-CM | POA: Diagnosis not present

## 2020-07-31 DIAGNOSIS — D2271 Melanocytic nevi of right lower limb, including hip: Secondary | ICD-10-CM | POA: Diagnosis not present

## 2020-07-31 DIAGNOSIS — D2262 Melanocytic nevi of left upper limb, including shoulder: Secondary | ICD-10-CM | POA: Diagnosis not present

## 2020-07-31 DIAGNOSIS — D225 Melanocytic nevi of trunk: Secondary | ICD-10-CM | POA: Diagnosis not present

## 2020-07-31 DIAGNOSIS — L821 Other seborrheic keratosis: Secondary | ICD-10-CM | POA: Diagnosis not present

## 2020-07-31 DIAGNOSIS — L814 Other melanin hyperpigmentation: Secondary | ICD-10-CM | POA: Diagnosis not present

## 2020-07-31 DIAGNOSIS — D1801 Hemangioma of skin and subcutaneous tissue: Secondary | ICD-10-CM | POA: Diagnosis not present

## 2020-07-31 DIAGNOSIS — D2272 Melanocytic nevi of left lower limb, including hip: Secondary | ICD-10-CM | POA: Diagnosis not present

## 2020-07-31 DIAGNOSIS — D22122 Melanocytic nevi of left lower eyelid, including canthus: Secondary | ICD-10-CM | POA: Diagnosis not present

## 2020-09-24 DIAGNOSIS — Z961 Presence of intraocular lens: Secondary | ICD-10-CM | POA: Diagnosis not present

## 2020-09-24 DIAGNOSIS — H40033 Anatomical narrow angle, bilateral: Secondary | ICD-10-CM | POA: Diagnosis not present

## 2020-09-24 DIAGNOSIS — Z9841 Cataract extraction status, right eye: Secondary | ICD-10-CM | POA: Diagnosis not present

## 2020-09-24 DIAGNOSIS — Z9842 Cataract extraction status, left eye: Secondary | ICD-10-CM | POA: Diagnosis not present

## 2020-09-24 DIAGNOSIS — Z8619 Personal history of other infectious and parasitic diseases: Secondary | ICD-10-CM | POA: Diagnosis not present

## 2020-09-24 DIAGNOSIS — H26493 Other secondary cataract, bilateral: Secondary | ICD-10-CM | POA: Diagnosis not present

## 2020-11-03 DIAGNOSIS — H26491 Other secondary cataract, right eye: Secondary | ICD-10-CM | POA: Diagnosis not present

## 2021-02-09 DIAGNOSIS — Z9841 Cataract extraction status, right eye: Secondary | ICD-10-CM | POA: Diagnosis not present

## 2021-02-09 DIAGNOSIS — Z9842 Cataract extraction status, left eye: Secondary | ICD-10-CM | POA: Diagnosis not present

## 2021-02-09 DIAGNOSIS — Z9889 Other specified postprocedural states: Secondary | ICD-10-CM | POA: Diagnosis not present

## 2021-02-09 DIAGNOSIS — Z8619 Personal history of other infectious and parasitic diseases: Secondary | ICD-10-CM | POA: Diagnosis not present

## 2021-02-09 DIAGNOSIS — Z961 Presence of intraocular lens: Secondary | ICD-10-CM | POA: Diagnosis not present

## 2021-02-23 ENCOUNTER — Other Ambulatory Visit: Payer: Self-pay | Admitting: Family Medicine

## 2021-02-23 DIAGNOSIS — Z1231 Encounter for screening mammogram for malignant neoplasm of breast: Secondary | ICD-10-CM

## 2021-03-31 ENCOUNTER — Other Ambulatory Visit: Payer: Self-pay

## 2021-03-31 ENCOUNTER — Ambulatory Visit: Payer: PPO

## 2021-03-31 ENCOUNTER — Ambulatory Visit
Admission: RE | Admit: 2021-03-31 | Discharge: 2021-03-31 | Disposition: A | Discharge status: Home / Self Care | Payer: PPO | Source: Ambulatory Visit | Attending: Family Medicine | Admitting: Family Medicine

## 2021-03-31 DIAGNOSIS — Z1231 Encounter for screening mammogram for malignant neoplasm of breast: Secondary | ICD-10-CM

## 2021-05-02 IMAGING — MG DIGITAL SCREENING BILAT W/ TOMO W/ CAD
8 series · 8 of 24 positions shown · non-contrast
Comparison: Previous exam(s).

CLINICAL DATA: Screening.

EXAM:
DIGITAL SCREENING BILATERAL MAMMOGRAM WITH TOMO AND CAD

[R CC synth-2D]
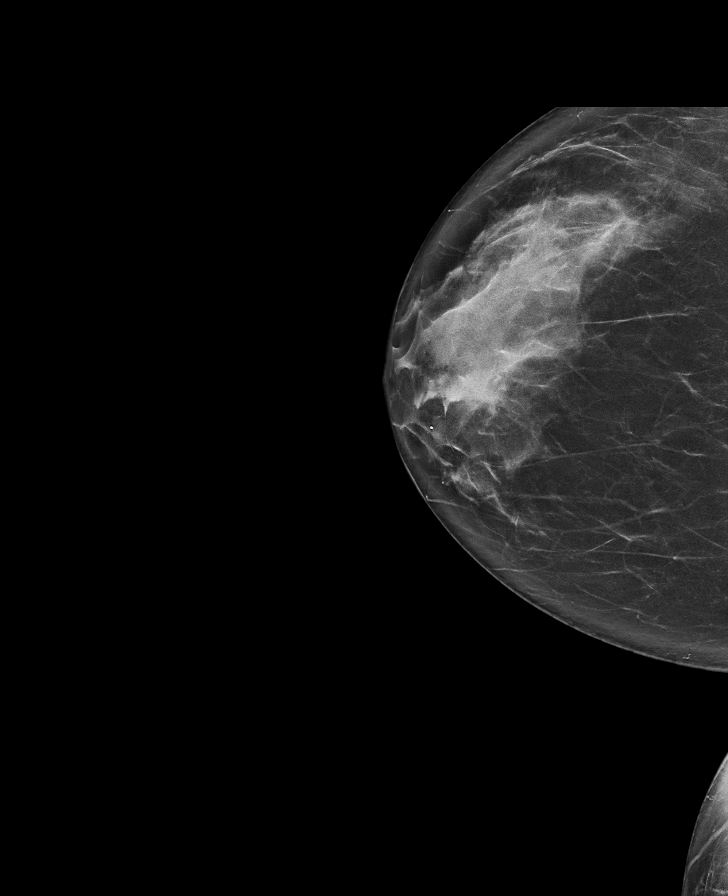

[L MLO synth-2D]
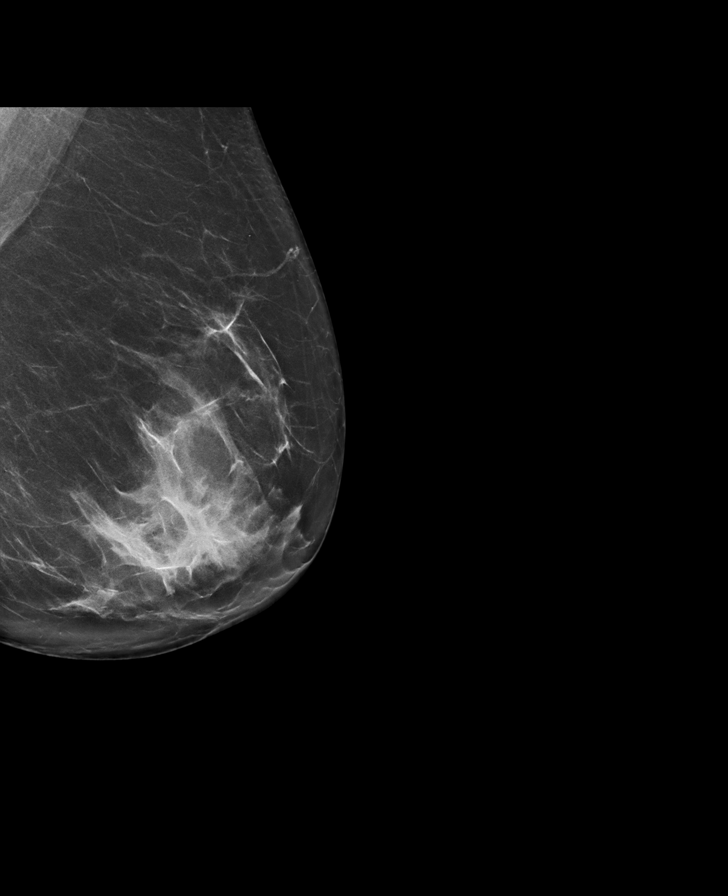

[R MLO synth-2D]
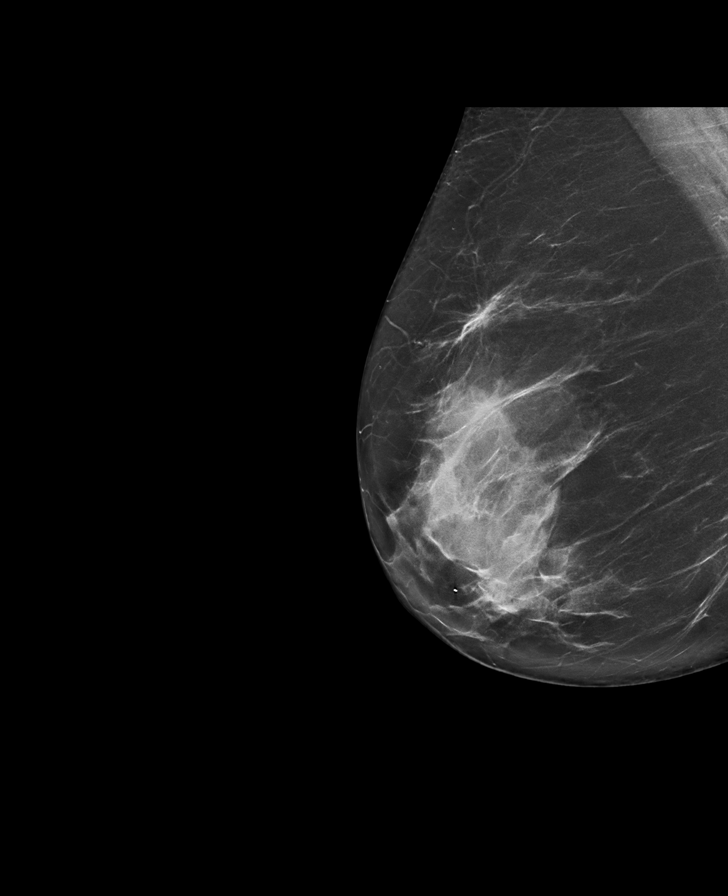

[L CC synth-2D]
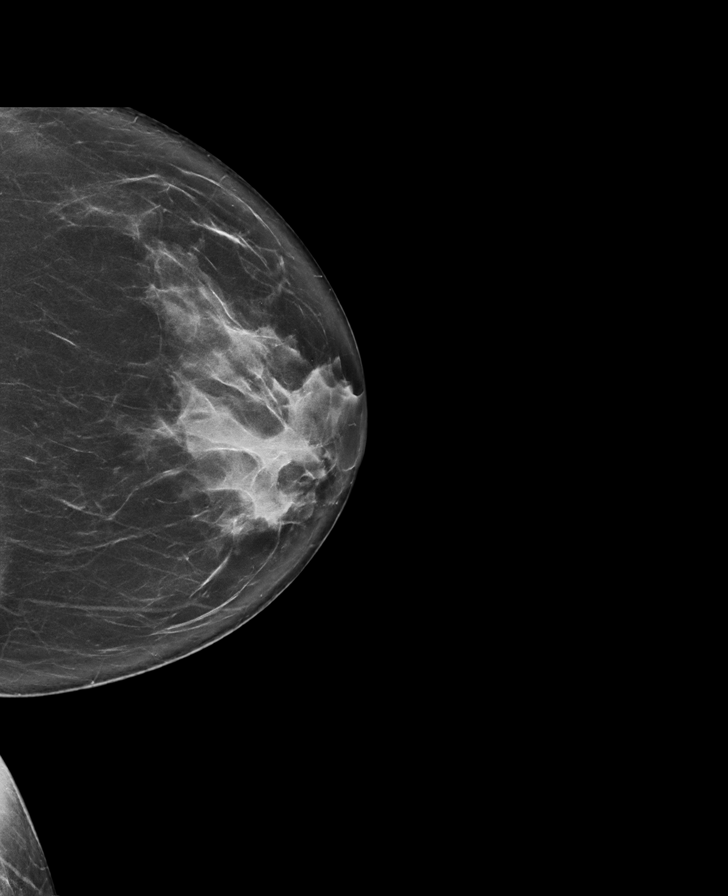

[L CC tomo · tomo slice 40/79.0]
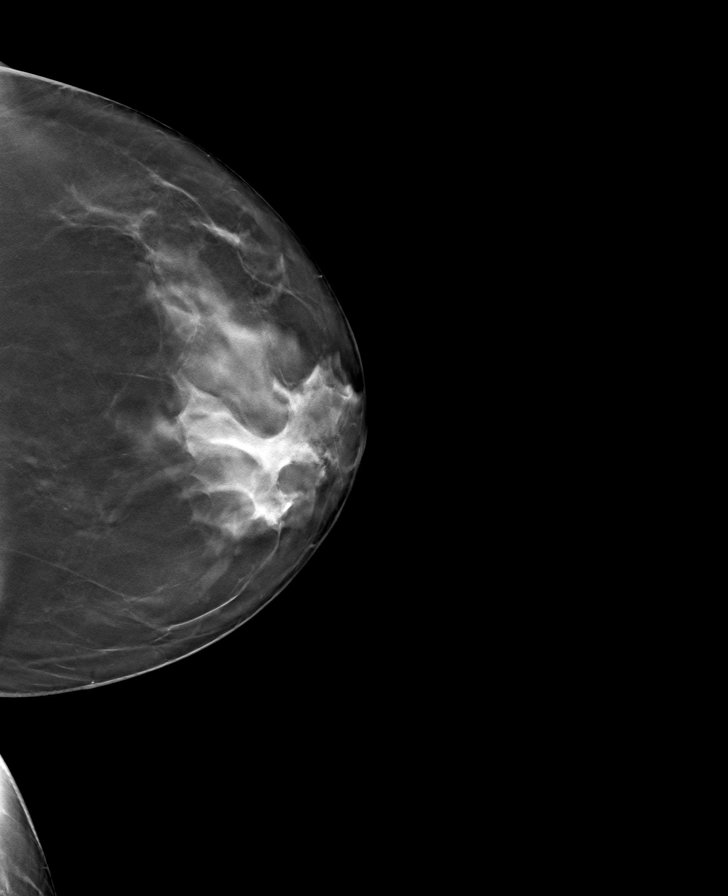

[R CC tomo · tomo slice 40/79.0]
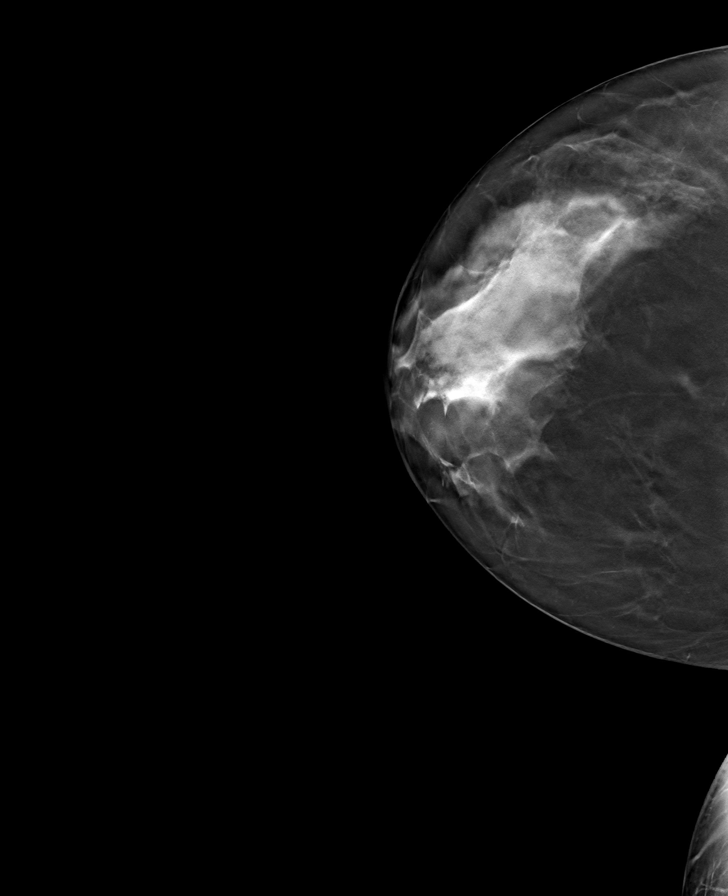

[L MLO tomo · tomo slice 43/84.0]
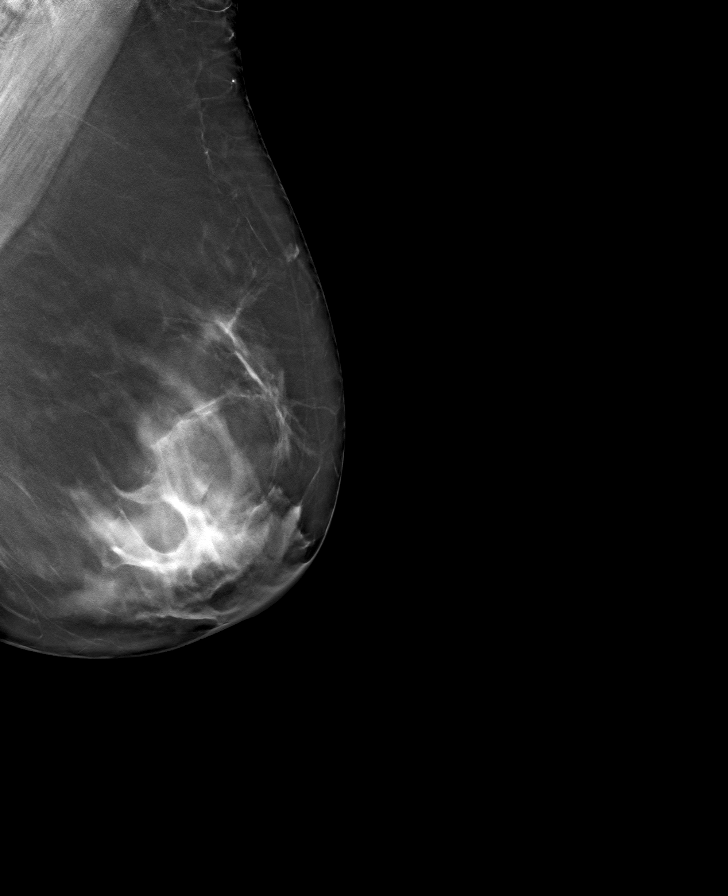

[R MLO tomo · tomo slice 42/83.0]
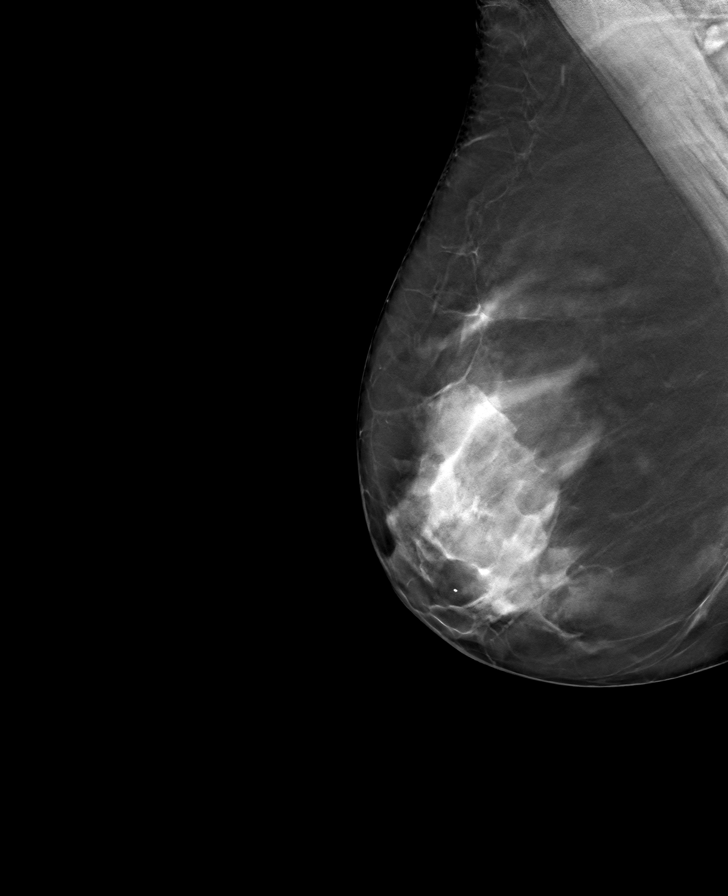

[8 of 24 positions shown; findings below may reference images not displayed]

ACR Breast Density Category d: The breast tissue is extremely dense,
which lowers the sensitivity of mammography
FINDINGS: There are no findings suspicious for malignancy. Images were
processed with CAD.
IMPRESSION: No mammographic evidence of malignancy. A result letter of this
screening mammogram will be mailed directly to the patient.

RECOMMENDATION:
Screening mammogram in one year. (Code:WO-[id number])

BI-RADS CATEGORY  1: Negative.

## 2021-06-12 ENCOUNTER — Other Ambulatory Visit (HOSPITAL_BASED_OUTPATIENT_CLINIC_OR_DEPARTMENT_OTHER): Payer: Self-pay

## 2021-06-12 ENCOUNTER — Other Ambulatory Visit: Payer: Self-pay

## 2021-06-12 ENCOUNTER — Ambulatory Visit: Payer: PPO | Attending: Internal Medicine

## 2021-06-12 DIAGNOSIS — Z23 Encounter for immunization: Secondary | ICD-10-CM

## 2021-06-12 MED ORDER — COVID-19 MRNA VACC (MODERNA) 100 MCG/0.5ML IM SUSP
INTRAMUSCULAR | 0 refills | Status: AC
  Filled 2021-06-12: qty 0.25, 1d supply, fill #0

## 2021-06-12 NOTE — Progress Notes (Signed)
   Covid-19 Vaccination Clinic  Name:  Carolyn Cantu    MRN: [id number] DOB: April 04, 1951  06/12/2021  [name] was observed post Covid-19 immunization for 15 minutes without incident. She was provided with Vaccine Information Sheet and instruction to access the V-Safe system.   [name] was instructed to call 911 with any severe reactions post vaccine: Difficulty breathing  Swelling of face and throat  A fast heartbeat  A bad rash all over body  Dizziness and weakness   Immunizations Administered     Name Date Dose VIS Date Route   Moderna Covid-19 Booster Vaccine 06/12/2021  1:15 PM 0.25 mL 10/01/2020 Intramuscular   Manufacturer: Moderna   Lot: [id number]   [place]: [id number]

## 2021-07-27 DIAGNOSIS — D2261 Melanocytic nevi of right upper limb, including shoulder: Secondary | ICD-10-CM | POA: Diagnosis not present

## 2021-07-27 DIAGNOSIS — L821 Other seborrheic keratosis: Secondary | ICD-10-CM | POA: Diagnosis not present

## 2021-07-27 DIAGNOSIS — L438 Other lichen planus: Secondary | ICD-10-CM | POA: Diagnosis not present

## 2021-07-27 DIAGNOSIS — L700 Acne vulgaris: Secondary | ICD-10-CM | POA: Diagnosis not present

## 2021-07-27 DIAGNOSIS — D2239 Melanocytic nevi of other parts of face: Secondary | ICD-10-CM | POA: Diagnosis not present

## 2021-07-27 DIAGNOSIS — D224 Melanocytic nevi of scalp and neck: Secondary | ICD-10-CM | POA: Diagnosis not present

## 2021-07-27 DIAGNOSIS — D2272 Melanocytic nevi of left lower limb, including hip: Secondary | ICD-10-CM | POA: Diagnosis not present

## 2021-07-27 DIAGNOSIS — D1801 Hemangioma of skin and subcutaneous tissue: Secondary | ICD-10-CM | POA: Diagnosis not present

## 2021-07-27 DIAGNOSIS — D2262 Melanocytic nevi of left upper limb, including shoulder: Secondary | ICD-10-CM | POA: Diagnosis not present

## 2021-07-27 DIAGNOSIS — D2271 Melanocytic nevi of right lower limb, including hip: Secondary | ICD-10-CM | POA: Diagnosis not present

## 2022-02-15 ENCOUNTER — Other Ambulatory Visit: Payer: Self-pay | Admitting: Family Medicine

## 2022-02-15 DIAGNOSIS — Z1231 Encounter for screening mammogram for malignant neoplasm of breast: Secondary | ICD-10-CM

## 2022-03-15 DIAGNOSIS — H5213 Myopia, bilateral: Secondary | ICD-10-CM | POA: Diagnosis not present

## 2022-03-15 DIAGNOSIS — H04123 Dry eye syndrome of bilateral lacrimal glands: Secondary | ICD-10-CM | POA: Diagnosis not present

## 2022-03-15 DIAGNOSIS — H1789 Other corneal scars and opacities: Secondary | ICD-10-CM | POA: Diagnosis not present

## 2022-03-15 DIAGNOSIS — H35372 Puckering of macula, left eye: Secondary | ICD-10-CM | POA: Diagnosis not present

## 2022-04-02 ENCOUNTER — Ambulatory Visit
Admission: RE | Admit: 2022-04-02 | Discharge: 2022-04-02 | Disposition: A | Discharge status: Home / Self Care | Payer: PPO | Source: Ambulatory Visit | Attending: Family Medicine | Admitting: Family Medicine

## 2022-04-02 DIAGNOSIS — Z1231 Encounter for screening mammogram for malignant neoplasm of breast: Secondary | ICD-10-CM | POA: Diagnosis not present

## 2022-07-19 ENCOUNTER — Other Ambulatory Visit (HOSPITAL_COMMUNITY): Payer: Self-pay

## 2022-07-28 DIAGNOSIS — D1801 Hemangioma of skin and subcutaneous tissue: Secondary | ICD-10-CM | POA: Diagnosis not present

## 2022-07-28 DIAGNOSIS — D2272 Melanocytic nevi of left lower limb, including hip: Secondary | ICD-10-CM | POA: Diagnosis not present

## 2022-07-28 DIAGNOSIS — L821 Other seborrheic keratosis: Secondary | ICD-10-CM | POA: Diagnosis not present

## 2022-07-28 DIAGNOSIS — B078 Other viral warts: Secondary | ICD-10-CM | POA: Diagnosis not present

## 2022-07-28 DIAGNOSIS — D2261 Melanocytic nevi of right upper limb, including shoulder: Secondary | ICD-10-CM | POA: Diagnosis not present

## 2022-07-28 DIAGNOSIS — D2262 Melanocytic nevi of left upper limb, including shoulder: Secondary | ICD-10-CM | POA: Diagnosis not present

## 2022-07-28 DIAGNOSIS — D225 Melanocytic nevi of trunk: Secondary | ICD-10-CM | POA: Diagnosis not present

## 2022-07-28 DIAGNOSIS — D2271 Melanocytic nevi of right lower limb, including hip: Secondary | ICD-10-CM | POA: Diagnosis not present

## 2022-07-28 DIAGNOSIS — L814 Other melanin hyperpigmentation: Secondary | ICD-10-CM | POA: Diagnosis not present

## 2023-03-03 ENCOUNTER — Other Ambulatory Visit: Payer: Self-pay | Admitting: Family Medicine

## 2023-03-03 DIAGNOSIS — Z1231 Encounter for screening mammogram for malignant neoplasm of breast: Secondary | ICD-10-CM

## 2023-03-28 DIAGNOSIS — H5213 Myopia, bilateral: Secondary | ICD-10-CM | POA: Diagnosis not present

## 2023-03-28 DIAGNOSIS — H1789 Other corneal scars and opacities: Secondary | ICD-10-CM | POA: Diagnosis not present

## 2023-03-28 DIAGNOSIS — H04123 Dry eye syndrome of bilateral lacrimal glands: Secondary | ICD-10-CM | POA: Diagnosis not present

## 2023-03-28 DIAGNOSIS — H35372 Puckering of macula, left eye: Secondary | ICD-10-CM | POA: Diagnosis not present

## 2023-03-28 DIAGNOSIS — Z961 Presence of intraocular lens: Secondary | ICD-10-CM | POA: Diagnosis not present

## 2023-04-18 ENCOUNTER — Ambulatory Visit
Admission: RE | Admit: 2023-04-18 | Discharge: 2023-04-18 | Disposition: A | Discharge status: Home / Self Care | Payer: PPO | Source: Ambulatory Visit | Attending: Family Medicine | Admitting: Family Medicine

## 2023-04-18 DIAGNOSIS — Z1231 Encounter for screening mammogram for malignant neoplasm of breast: Secondary | ICD-10-CM

## 2023-09-26 DIAGNOSIS — L720 Epidermal cyst: Secondary | ICD-10-CM | POA: Diagnosis not present

## 2023-09-26 DIAGNOSIS — D2272 Melanocytic nevi of left lower limb, including hip: Secondary | ICD-10-CM | POA: Diagnosis not present

## 2023-09-26 DIAGNOSIS — D225 Melanocytic nevi of trunk: Secondary | ICD-10-CM | POA: Diagnosis not present

## 2023-09-26 DIAGNOSIS — L814 Other melanin hyperpigmentation: Secondary | ICD-10-CM | POA: Diagnosis not present

## 2023-09-26 DIAGNOSIS — D1801 Hemangioma of skin and subcutaneous tissue: Secondary | ICD-10-CM | POA: Diagnosis not present

## 2023-09-26 DIAGNOSIS — D2261 Melanocytic nevi of right upper limb, including shoulder: Secondary | ICD-10-CM | POA: Diagnosis not present

## 2023-09-26 DIAGNOSIS — L718 Other rosacea: Secondary | ICD-10-CM | POA: Diagnosis not present

## 2023-09-26 DIAGNOSIS — L570 Actinic keratosis: Secondary | ICD-10-CM | POA: Diagnosis not present

## 2023-09-26 DIAGNOSIS — D2271 Melanocytic nevi of right lower limb, including hip: Secondary | ICD-10-CM | POA: Diagnosis not present

## 2023-09-26 DIAGNOSIS — D2262 Melanocytic nevi of left upper limb, including shoulder: Secondary | ICD-10-CM | POA: Diagnosis not present

## 2023-09-26 DIAGNOSIS — L821 Other seborrheic keratosis: Secondary | ICD-10-CM | POA: Diagnosis not present

## 2024-03-02 DIAGNOSIS — H9193 Unspecified hearing loss, bilateral: Secondary | ICD-10-CM | POA: Diagnosis not present

## 2024-03-02 DIAGNOSIS — H903 Sensorineural hearing loss, bilateral: Secondary | ICD-10-CM | POA: Diagnosis not present

## 2024-03-08 ENCOUNTER — Other Ambulatory Visit: Payer: Self-pay | Admitting: Family Medicine

## 2024-03-08 DIAGNOSIS — Z1231 Encounter for screening mammogram for malignant neoplasm of breast: Secondary | ICD-10-CM

## 2024-04-09 DIAGNOSIS — H35372 Puckering of macula, left eye: Secondary | ICD-10-CM | POA: Diagnosis not present

## 2024-04-09 DIAGNOSIS — Z961 Presence of intraocular lens: Secondary | ICD-10-CM | POA: Diagnosis not present

## 2024-04-09 DIAGNOSIS — H52201 Unspecified astigmatism, right eye: Secondary | ICD-10-CM | POA: Diagnosis not present

## 2024-04-20 ENCOUNTER — Ambulatory Visit
Admission: RE | Admit: 2024-04-20 | Discharge: 2024-04-20 | Disposition: A | Discharge status: Home / Self Care | Source: Ambulatory Visit | Attending: Family Medicine | Admitting: Family Medicine

## 2024-04-20 DIAGNOSIS — Z1231 Encounter for screening mammogram for malignant neoplasm of breast: Secondary | ICD-10-CM

## 2024-08-31 DIAGNOSIS — M19072 Primary osteoarthritis, left ankle and foot: Secondary | ICD-10-CM | POA: Diagnosis not present

## 2024-08-31 DIAGNOSIS — M2012 Hallux valgus (acquired), left foot: Secondary | ICD-10-CM | POA: Diagnosis not present

## 2024-08-31 DIAGNOSIS — M19071 Primary osteoarthritis, right ankle and foot: Secondary | ICD-10-CM | POA: Diagnosis not present

## 2024-08-31 DIAGNOSIS — M2011 Hallux valgus (acquired), right foot: Secondary | ICD-10-CM | POA: Diagnosis not present

## 2024-09-27 ENCOUNTER — Ambulatory Visit

## 2024-09-27 ENCOUNTER — Ambulatory Visit (INDEPENDENT_AMBULATORY_CARE_PROVIDER_SITE_OTHER)

## 2024-09-27 DIAGNOSIS — M79672 Pain in left foot: Secondary | ICD-10-CM | POA: Diagnosis not present

## 2024-09-27 DIAGNOSIS — M79671 Pain in right foot: Secondary | ICD-10-CM

## 2024-09-27 NOTE — Progress Notes (Signed)
 Orthotics- cash pay    Subjective:  Patient ID: Carolyn Cantu, female    DOB: 07/08/1951,  MRN: [id number]  Chief Complaint  Patient presents with   Foot Orthotics    Patient is here to discuss with the doctor that she needs new orthotics because the ones she has are old     73 y.o. female presents with the above complaint.  Patient states that she had custom orthotics made here approximately 3 to 4 years ago that worked quite well, they have recently begun to lose their shape and some of their effectiveness.  She is interested in acquiring new custom orthotics today.  Review of Systems: Negative except as noted in the HPI. Denies N/V/F/Ch.  Past Medical History:  Diagnosis Date   Anxiety current   due to diagnosis of breast mass   Arthritis    right foot/wrist; neck   Glaucoma, narrow-angle    had laser surg.; no current med.   Mammogram abnormal 12/2011   Neck pain    old injury from [facility]; difficulty turning head to the right    Current Outpatient Medications:    Acetaminophen (TYLENOL PO), Take by mouth as needed., Disp: , Rfl:    COVID-19 mRNA vaccine, Moderna, 100 MCG/0.5ML injection, Inject into the muscle., Disp: 0.25 mL, Rfl: 0   triamcinolone cream (KENALOG) 0.1 %, Apply 1 application topically as needed., Disp: , Rfl:   Social History   Tobacco Use  Smoking Status Never  Smokeless Tobacco Never    Allergies  Allergen Reactions   Paba (Para-Aminobenzoate) Derivatives     Pt stated, I am allergic to this in sunscreen lotion   Adhesive [Tape] Rash   Latex Rash   Objective:  There were no vitals filed for this visit. There is no height or weight on file to calculate BMI. Constitutional Well developed. Well nourished. Oriented to person, place, and time.  Vascular Dorsalis pedis pulses palpable bilaterally. Posterior tibial pulses palpable bilaterally. Capillary refill normal to all digits.  No cyanosis or clubbing noted. Pedal hair growth normal.   Neurologic Normal speech. Epicritic sensation to light touch grossly present bilaterally. Negative tinel sign at tarsal tunnel bilaterally.   Dermatologic Skin texture and turgor are within normal limits.  No open wounds. No skin lesions.  Musculoskeletal: 5 out of 5 muscle strength all major pedal muscle groups.  No focal areas of tenderness.  Bilateral hallux abducto valgus deformities with the hallux encroaching on the second digit.  No pain with range of motion of first metatarsophalangeal joint.  Rectus foot shape.   Radiographs: Taken and reviewed.  3 weightbearing views of bilateral feet were taken.  These show generally well-preserved joint spaces, the right subtalar joint does have some narrowing.  Rectus foot shape bilaterally.  Increased IM 1-2 bilaterally.  Assessment:   1. Foot pain, left   2. Foot pain, right    Plan:  - Patient was evaluated and treated and all questions answered.  1. Foot pain, bilateral - Patient has utilized custom orthotics for extended period of time with great success.  She states that when she does not wear these her feet begin to ache and are sore.  She is interested in acquiring new custom orthotics today.  I do think that this would benefit her and allow her to continue to have pain-free ambulation. - Today, she was fitted for custom orthotics.  She will return to clinic for dispensing of orthotics.  RTC for orthotic dispensing  Prentice  Magdalen, DPM AACFAS Fellowship Trained Podiatric Surgeon [facility]

## 2024-10-01 ENCOUNTER — Telehealth: Payer: Self-pay

## 2024-10-01 NOTE — Telephone Encounter (Signed)
 Sent 1pr refurbish to [facility]  Patient will owe $90 upon delivery  Carolyn Cantu

## 2024-10-19 ENCOUNTER — Telehealth: Payer: Self-pay

## 2024-10-19 DIAGNOSIS — E2839 Other primary ovarian failure: Secondary | ICD-10-CM | POA: Diagnosis not present

## 2024-10-19 DIAGNOSIS — Z8249 Family history of ischemic heart disease and other diseases of the circulatory system: Secondary | ICD-10-CM | POA: Diagnosis not present

## 2024-10-19 DIAGNOSIS — E559 Vitamin D deficiency, unspecified: Secondary | ICD-10-CM | POA: Diagnosis not present

## 2024-10-19 DIAGNOSIS — Z13220 Encounter for screening for lipoid disorders: Secondary | ICD-10-CM | POA: Diagnosis not present

## 2024-10-19 DIAGNOSIS — Z7689 Persons encountering health services in other specified circumstances: Secondary | ICD-10-CM | POA: Diagnosis not present

## 2024-10-19 DIAGNOSIS — M810 Age-related osteoporosis without current pathological fracture: Secondary | ICD-10-CM | POA: Diagnosis not present

## 2024-10-19 DIAGNOSIS — R040 Epistaxis: Secondary | ICD-10-CM | POA: Diagnosis not present

## 2024-10-19 DIAGNOSIS — Z13228 Encounter for screening for other metabolic disorders: Secondary | ICD-10-CM | POA: Diagnosis not present

## 2024-10-19 DIAGNOSIS — Z832 Family history of diseases of the blood and blood-forming organs and certain disorders involving the immune mechanism: Secondary | ICD-10-CM | POA: Diagnosis not present

## 2024-10-19 DIAGNOSIS — Z0000 Encounter for general adult medical examination without abnormal findings: Secondary | ICD-10-CM | POA: Diagnosis not present

## 2024-10-19 DIAGNOSIS — Z130 Encounter for screening for diseases of the blood and blood-forming organs and certain disorders involving the immune mechanism: Secondary | ICD-10-CM | POA: Diagnosis not present

## 2024-10-19 NOTE — Telephone Encounter (Signed)
 Orthotics are here Balance $406.50 Financial form signed and on file Appt set for 11/13

## 2024-10-20 ENCOUNTER — Other Ambulatory Visit (HOSPITAL_BASED_OUTPATIENT_CLINIC_OR_DEPARTMENT_OTHER): Payer: Self-pay | Admitting: Family Medicine

## 2024-10-20 DIAGNOSIS — E2839 Other primary ovarian failure: Secondary | ICD-10-CM

## 2024-10-22 ENCOUNTER — Other Ambulatory Visit (HOSPITAL_COMMUNITY): Payer: Self-pay | Admitting: Family Medicine

## 2024-10-22 DIAGNOSIS — L821 Other seborrheic keratosis: Secondary | ICD-10-CM | POA: Diagnosis not present

## 2024-10-22 DIAGNOSIS — D2372 Other benign neoplasm of skin of left lower limb, including hip: Secondary | ICD-10-CM | POA: Diagnosis not present

## 2024-10-22 DIAGNOSIS — L718 Other rosacea: Secondary | ICD-10-CM | POA: Diagnosis not present

## 2024-10-22 DIAGNOSIS — D225 Melanocytic nevi of trunk: Secondary | ICD-10-CM | POA: Diagnosis not present

## 2024-10-22 DIAGNOSIS — L814 Other melanin hyperpigmentation: Secondary | ICD-10-CM | POA: Diagnosis not present

## 2024-10-22 DIAGNOSIS — Z8249 Family history of ischemic heart disease and other diseases of the circulatory system: Secondary | ICD-10-CM

## 2024-10-22 DIAGNOSIS — D2271 Melanocytic nevi of right lower limb, including hip: Secondary | ICD-10-CM | POA: Diagnosis not present

## 2024-10-22 DIAGNOSIS — D2239 Melanocytic nevi of other parts of face: Secondary | ICD-10-CM | POA: Diagnosis not present

## 2024-10-22 DIAGNOSIS — D1801 Hemangioma of skin and subcutaneous tissue: Secondary | ICD-10-CM | POA: Diagnosis not present

## 2024-10-25 ENCOUNTER — Ambulatory Visit

## 2024-10-25 NOTE — Progress Notes (Signed)
 Patient presents today to pick up custom molded foot orthotics,and one pr refurbished diagnosed with Foot Pain by Dr. Arne .   Orthotics were dispensed and fit was satisfactory. Reviewed instructions for break-in and wear. Written instructions given to patient.  Patient will follow up as needed. Lolita Schultze Cped, CFo, CFm

## 2024-10-31 ENCOUNTER — Ambulatory Visit (HOSPITAL_BASED_OUTPATIENT_CLINIC_OR_DEPARTMENT_OTHER)
Admission: RE | Admit: 2024-10-31 | Discharge: 2024-10-31 | Disposition: A | Discharge status: Home / Self Care | Payer: Self-pay | Source: Ambulatory Visit | Attending: Family Medicine | Admitting: Family Medicine

## 2024-10-31 DIAGNOSIS — Z8249 Family history of ischemic heart disease and other diseases of the circulatory system: Secondary | ICD-10-CM | POA: Insufficient documentation

## 2024-11-07 DIAGNOSIS — Z832 Family history of diseases of the blood and blood-forming organs and certain disorders involving the immune mechanism: Secondary | ICD-10-CM | POA: Diagnosis not present

## 2024-11-07 DIAGNOSIS — Z130 Encounter for screening for diseases of the blood and blood-forming organs and certain disorders involving the immune mechanism: Secondary | ICD-10-CM | POA: Diagnosis not present

## 2024-11-07 DIAGNOSIS — Z13220 Encounter for screening for lipoid disorders: Secondary | ICD-10-CM | POA: Diagnosis not present

## 2024-11-07 DIAGNOSIS — Z13228 Encounter for screening for other metabolic disorders: Secondary | ICD-10-CM | POA: Diagnosis not present

## 2024-11-07 DIAGNOSIS — Z136 Encounter for screening for cardiovascular disorders: Secondary | ICD-10-CM | POA: Diagnosis not present

## 2024-11-07 DIAGNOSIS — R040 Epistaxis: Secondary | ICD-10-CM | POA: Diagnosis not present

## 2024-11-07 DIAGNOSIS — E559 Vitamin D deficiency, unspecified: Secondary | ICD-10-CM | POA: Diagnosis not present
